# Patient Record
Sex: Male | Born: 1955 | Race: Black or African American | Hispanic: No | Marital: Married | Smoking: Never smoker
Health system: Southern US, Community
[De-identification: ages and names within clinical notes are randomized; demographics above are authoritative.]

---

## 2003-08-14 ENCOUNTER — Emergency Department (HOSPITAL_COMMUNITY): Admission: EM | Admit: 2003-08-14 | Discharge: 2003-08-14 | Payer: Self-pay | Admitting: Emergency Medicine

## 2008-04-07 ENCOUNTER — Ambulatory Visit (HOSPITAL_COMMUNITY): Admission: RE | Admit: 2008-04-07 | Discharge: 2008-04-07 | Payer: Self-pay | Admitting: Family Medicine

## 2010-07-10 NOTE — Procedures (Signed)
Charles Hurst, Charles Hurst NO.:  [id number]   MEDICAL RECORD NO.:  [id number]          PATIENT TYPE:  OUT   LOCATION:  [facility]                          FACILITY:  [facility]   PHYSICIAN:  Donna Bernard, M.D.DATE OF BIRTH:  01-Jul-1955   DATE OF PROCEDURE:  DATE OF DISCHARGE:                                  STRESS TEST   INDICATION:  This patient is a 55 year old African American male with a  history of atypical discomfort, which occurred while shoveling snow.  He  does have some risk factors, so we felt that stress testing was an  appropriate intervention.  The patient has had no chest discomfort since  the day of shoveling snow.   Stress test was performed with standard Bruce protocol.  His EKG  revealed normal sinus rhythm with no significant ST-T changes.  The  patient's initial blood pressure was 138/82.  The patient tolerated the  first two stages well, into the third stage, he started to have a bit of  shortness of breath as expected.  He also generated hypertensive  responses but expected with maximum systolic of 180, no time during the  test did the patient have any chest pain.  At 40 seconds into the fourth  stage, the test was stopped on the basis of having surpassed the  __________max predicted heart rate and reached a heart rate of 154.  At  this point, his ST segment at 0.08 seconds past the J-point showed a  sharply ascending slope with minimal ST depression in all leads.  The  patient during the rest phase returned to his normal heart rate quickly.  He did have continued ST segment changes, which lasted for couple of  minutes, but continued to maintain a sharp ascending slope at 0.08  seconds past the J-point.   IMPRESSION:  Negative adequate stress test.   PLAN:  The patient encouraged to get on with regular exercise program.  Rationale discussed.  Follow up in the office as scheduled.      Donna Bernard, M.D.  Electronically Signed     WSL/MEDQ  D:  04/07/2008  T:  04/07/2008  Job:  [id number]

## 2012-09-05 ENCOUNTER — Other Ambulatory Visit: Payer: Self-pay | Admitting: Family Medicine

## 2012-11-09 ENCOUNTER — Other Ambulatory Visit: Payer: Self-pay | Admitting: Family Medicine

## 2012-12-11 ENCOUNTER — Ambulatory Visit (INDEPENDENT_AMBULATORY_CARE_PROVIDER_SITE_OTHER): Payer: BC Managed Care – PPO | Admitting: Family Medicine

## 2012-12-11 ENCOUNTER — Encounter: Payer: Self-pay | Admitting: Family Medicine

## 2012-12-11 VITALS — BP 146/90 | Ht 70.0 in | Wt 164.2 lb

## 2012-12-11 DIAGNOSIS — E785 Hyperlipidemia, unspecified: Secondary | ICD-10-CM

## 2012-12-11 DIAGNOSIS — Z79899 Other long term (current) drug therapy: Secondary | ICD-10-CM

## 2012-12-11 DIAGNOSIS — Z0000 Encounter for general adult medical examination without abnormal findings: Secondary | ICD-10-CM

## 2012-12-11 DIAGNOSIS — R7301 Impaired fasting glucose: Secondary | ICD-10-CM

## 2012-12-11 DIAGNOSIS — I10 Essential (primary) hypertension: Secondary | ICD-10-CM

## 2012-12-11 MED ORDER — HYDROCHLOROTHIAZIDE 25 MG PO TABS: ORAL_TABLET | ORAL | Status: DC

## 2012-12-11 MED ORDER — ENALAPRIL MALEATE 20 MG PO TABS: ORAL_TABLET | ORAL | Status: DC

## 2012-12-11 MED ORDER — VERAPAMIL HCL ER 240 MG PO TBCR: EXTENDED_RELEASE_TABLET | ORAL | Status: DC

## 2012-12-11 NOTE — Progress Notes (Signed)
  Subjective:    Patient ID: Charles Hurst, male    DOB: [birth year], 57 y.o.   MRN: [id number]  HPI Patient is here today for a check up.cks bp elsewhere. Numbers at home 119 over 80  He needs a refill on his meds.exercising regularly with the dogs. Walking at work.   No concerns. No obvious side effects from his medication.  Patient has history of impaired fasting glucose. He has tried to cut down sugars in his diet. Exercising regularly.  Patient also has history of hyperlipidemia. He has tried to cut down his cholesterol intake. Cut down fried foods and greasy foods.  Review of Systems No chest pain no headache no back pain no abdominal pain no change in bowel habits ROS otherwise negative    Objective:   Physical Exam Alert HEENT normal. Lungs clear. Heart regular in rhythm. Abdomen soft. Ankles without edema. Blood pressure repeat 138/84       Assessment & Plan:  Impression 1 hypertension good control. #2 hyperlipidemia status uncertain. Discussed. #3 impaired fasting glucose status discussed uncertain. Plan diet exercise discussed. Maintain same meds. Appropriate blood work. Meds followup in 6 months. WSL

## 2012-12-13 DIAGNOSIS — E785 Hyperlipidemia, unspecified: Secondary | ICD-10-CM | POA: Insufficient documentation

## 2012-12-13 DIAGNOSIS — I10 Essential (primary) hypertension: Secondary | ICD-10-CM | POA: Insufficient documentation

## 2012-12-13 DIAGNOSIS — R7301 Impaired fasting glucose: Secondary | ICD-10-CM | POA: Insufficient documentation

## 2013-02-26 ENCOUNTER — Ambulatory Visit (INDEPENDENT_AMBULATORY_CARE_PROVIDER_SITE_OTHER): Payer: BC Managed Care – PPO | Admitting: Nurse Practitioner

## 2013-02-26 ENCOUNTER — Encounter: Payer: Self-pay | Admitting: Nurse Practitioner

## 2013-02-26 VITALS — BP 150/90 | Temp 98.3°F | Ht 70.0 in | Wt 166.1 lb

## 2013-02-26 DIAGNOSIS — K589 Irritable bowel syndrome without diarrhea: Secondary | ICD-10-CM

## 2013-02-26 MED ORDER — HYOSCYAMINE SULFATE 0.125 MG SL SUBL
0.1250 mg | SUBLINGUAL_TABLET | SUBLINGUAL | Status: DC | PRN
Start: 2013-02-26 — End: 2013-10-11

## 2013-03-01 ENCOUNTER — Encounter: Payer: Self-pay | Admitting: Nurse Practitioner

## 2013-03-01 DIAGNOSIS — K589 Irritable bowel syndrome without diarrhea: Secondary | ICD-10-CM | POA: Insufficient documentation

## 2013-03-01 NOTE — Progress Notes (Signed)
Subjective:  Charles Hurst complaints of pain in the left lower quadrant of the abdomen for the past 2 days. Has a history of duodenal ulcer and acid reflux both which are stable. No fever. No nausea vomiting. No constipation or diarrhea. Stools normal color. No obvious blood. Pain has improved. Describes as a discomfort with occasional cramping. No severe pain. Has a history of IBS. No urinary symptoms.  Objective:   BP 150/90  Temp(Src) 98.3 F (36.8 C) (Oral)  Ht 5\' 10"  (1.778 m)  Wt 166 lb 2 oz (75.354 kg)  BMI 23.84 kg/m2 NAD. Alert, oriented. Lungs clear. Heart regular rate rhythm. Abdomen soft nondistended with hyperactive bowel sounds x4. Minimal left lower quadrant tenderness on examination, exam otherwise benign. No rebound or guarding. No obvious masses.  Assessment:Irritable bowel syndrome  Plan: Meds ordered this encounter  Medications  . hyoscyamine (LEVSIN/SL) 0.125 MG SL tablet    Sig: Place 1 tablet (0.125 mg total) under the tongue every 4 (four) hours as needed.    Dispense:  30 tablet    Refill:  0    Order Specific Question:  Supervising Provider    Answer:  Merlyn AlbertLUKING, WILLIAM S [[id number]]   Reviewed warning signs including fever severe abdominal pain bloody stools. Patient to go to ED over the weekend if symptoms worsen. Recheck if persists. Also strongly recommend screening colonoscopy.

## 2013-03-01 NOTE — Assessment & Plan Note (Signed)
.   hyoscyamine (LEVSIN/SL) 0.125 MG SL tablet    Sig: Place 1 tablet (0.125 mg total) under the tongue every 4 (four) hours as needed.    Dispense:  30 tablet    Refill:  0    Order Specific Question:  Supervising Provider    Answer:  Merlyn AlbertLUKING, WILLIAM S [[id number]]   Reviewed warning signs including fever severe abdominal pain bloody stools. Patient to go to ED over the weekend if symptoms worsen. Recheck if persists. Also strongly recommend screening colonoscopy.

## 2013-03-06 LAB — HEPATIC FUNCTION PANEL
ALT: 23 U/L (ref 0–53)
AST: 31 U/L (ref 0–37)
Albumin: 4.4 g/dL (ref 3.5–5.2)
Alkaline Phosphatase: 115 U/L (ref 39–117)
Bilirubin, Direct: 0.1 mg/dL (ref 0.0–0.3)
Indirect Bilirubin: 0.4 mg/dL (ref 0.0–0.9)
Total Bilirubin: 0.5 mg/dL (ref 0.3–1.2)
Total Protein: 7 g/dL (ref 6.0–8.3)

## 2013-03-06 LAB — LIPID PANEL
Cholesterol: 229 mg/dL — ABNORMAL HIGH (ref 0–200)
HDL: 66 mg/dL (ref >39)
LDL Cholesterol: 153 mg/dL — ABNORMAL HIGH (ref 0–99)
Total CHOL/HDL Ratio: 3.5 Ratio
Triglycerides: 50 mg/dL (ref <150)
VLDL: 10 mg/dL (ref 0–40)

## 2013-03-06 LAB — GLUCOSE, RANDOM: Glucose, Bld: 113 mg/dL — ABNORMAL HIGH (ref 70–99)

## 2013-03-24 ENCOUNTER — Encounter: Payer: Self-pay | Admitting: Family Medicine

## 2013-06-21 ENCOUNTER — Other Ambulatory Visit: Payer: Self-pay | Admitting: Family Medicine

## 2013-07-14 ENCOUNTER — Encounter: Payer: Self-pay | Admitting: Family Medicine

## 2013-07-14 ENCOUNTER — Ambulatory Visit (INDEPENDENT_AMBULATORY_CARE_PROVIDER_SITE_OTHER): Payer: BC Managed Care – PPO | Admitting: Family Medicine

## 2013-07-14 VITALS — BP 146/94 | Ht 70.0 in | Wt 163.0 lb

## 2013-07-14 DIAGNOSIS — I10 Essential (primary) hypertension: Secondary | ICD-10-CM

## 2013-07-14 DIAGNOSIS — E785 Hyperlipidemia, unspecified: Secondary | ICD-10-CM

## 2013-07-14 DIAGNOSIS — J309 Allergic rhinitis, unspecified: Secondary | ICD-10-CM

## 2013-07-14 DIAGNOSIS — K589 Irritable bowel syndrome without diarrhea: Secondary | ICD-10-CM

## 2013-07-14 DIAGNOSIS — R7301 Impaired fasting glucose: Secondary | ICD-10-CM

## 2013-07-14 MED ORDER — HYDROCHLOROTHIAZIDE 25 MG PO TABS: 25.0000 mg | ORAL_TABLET | Freq: Every day | ORAL | Status: DC

## 2013-07-14 MED ORDER — VERAPAMIL HCL ER 240 MG PO TBCR: 240.0000 mg | EXTENDED_RELEASE_TABLET | Freq: Every day | ORAL | Status: DC

## 2013-07-14 MED ORDER — ENALAPRIL MALEATE 20 MG PO TABS: 20.0000 mg | ORAL_TABLET | Freq: Every day | ORAL | Status: DC

## 2013-07-14 NOTE — Progress Notes (Signed)
   Subjective:    Patient ID: Jacquiline DoeGerald Q Messer, male    DOB: 05/02/1955, 58 y.o.   MRN: [id number]  HPIHypertension check up. Checks blood pressure at home. States numbers are good. Exercise. Walks about every other day. Follows a healthy diet.   Allergies. Having a cough at night. Taking mucinex. Allergies bothering some, mucinex has calmed it down  Uses claritin orin past  Walking every other day  Mostly eating the right stuff, eting right  occas soda mostly good p o  Compliant with blood pressure medications. Review of Systems No headache no chest pain or back pain no abdominal pain no change in bowel habits no blood in stool ROS otherwise negative    Objective:   Physical Exam Alert no apparent distress. HEENT normal. Lungs clear. Heart regular in rhythm. Ankles without edema.       Assessment & Plan:  Pressure 1 hypertension good control. #2 allergic rhinitis discussed. #3 glucose intolerance discuss. #4 hyperlipidemia discussed. Plan diet exercise discussed. Maintain same medications. Symptomatic care discussed. Keep exercising. Check every 6 months. Of note had a recent prostate exam. Promises he will do a colonoscopy this fall. WSL

## 2013-07-22 ENCOUNTER — Other Ambulatory Visit: Payer: Self-pay | Admitting: Family Medicine

## 2013-09-09 ENCOUNTER — Telehealth: Payer: Self-pay | Admitting: Family Medicine

## 2013-09-09 DIAGNOSIS — Z79899 Other long term (current) drug therapy: Secondary | ICD-10-CM

## 2013-09-09 DIAGNOSIS — R7301 Impaired fasting glucose: Secondary | ICD-10-CM

## 2013-09-09 DIAGNOSIS — E785 Hyperlipidemia, unspecified: Secondary | ICD-10-CM

## 2013-09-09 DIAGNOSIS — Z125 Encounter for screening for malignant neoplasm of prostate: Secondary | ICD-10-CM

## 2013-09-09 NOTE — Telephone Encounter (Signed)
In addition to the PSA add lipid, metabolic 7, hemoglobin A1c.

## 2013-09-09 NOTE — Telephone Encounter (Signed)
bloodwork orders ready. Pt notified.  

## 2013-09-09 NOTE — Telephone Encounter (Signed)
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 He wants to know the urgency of this and when he should follow up?

## 2013-09-09 NOTE — Telephone Encounter (Signed)
I would recommend that the patient do a PSA. I also recommend that the patient followup within the next 30 days for office visit to be prepared to give a urine specimen when he comes in addition to this we will be checking his prostate. Although benign conditions can cause blood to show up in the urine so can cancer. This is something that he should follow through with. It is not wise to ignore this. Although it is not an emergent condition he should handle this within the next 30 days.(DOT does a dipstick test which can detect blood that may not be seen on a visual exam so therefore he would need to come to do the above things in order to be 100% safe to make sure there is not additional issues causing his problem)

## 2013-09-09 NOTE — Telephone Encounter (Signed)
Not having any urinary symptoms. No fever, no abd pain, no back pain, no frequent urination, has not seen any blood in his urine.

## 2013-09-09 NOTE — Telephone Encounter (Signed)
Pt would like to know if he needs any additional bloodwork besides psa. Last bw 03/06/13 lipid, liver, glucose. He has hyperlipidema, impaired fasting glucose and hypertension.  He made a follow up appt with Dr. Brett CanalesSteve.

## 2013-09-11 LAB — HEMOGLOBIN A1C
HEMOGLOBIN A1C: 5.6 % (ref <5.7)
Mean Plasma Glucose: 114 mg/dL (ref <117)

## 2013-09-11 LAB — LIPID PANEL
CHOL/HDL RATIO: 3.1 ratio
Cholesterol: 235 mg/dL — ABNORMAL HIGH (ref 0–200)
HDL: 76 mg/dL (ref >39)
LDL Cholesterol: 149 mg/dL — ABNORMAL HIGH (ref 0–99)
Triglycerides: 49 mg/dL (ref <150)
VLDL: 10 mg/dL (ref 0–40)

## 2013-09-11 LAB — BASIC METABOLIC PANEL
BUN: 17 mg/dL (ref 6–23)
CALCIUM: 9.8 mg/dL (ref 8.4–10.5)
CO2: 29 meq/L (ref 19–32)
Chloride: 102 mEq/L (ref 96–112)
Creat: 1.48 mg/dL — ABNORMAL HIGH (ref 0.50–1.35)
GLUCOSE: 107 mg/dL — AB (ref 70–99)
Potassium: 4.4 mEq/L (ref 3.5–5.3)
Sodium: 141 mEq/L (ref 135–145)

## 2013-09-13 LAB — PSA: PSA: 0.81 ng/mL (ref <=4.00)

## 2013-10-11 ENCOUNTER — Ambulatory Visit (INDEPENDENT_AMBULATORY_CARE_PROVIDER_SITE_OTHER): Payer: BC Managed Care – PPO | Admitting: Family Medicine

## 2013-10-11 ENCOUNTER — Encounter: Payer: Self-pay | Admitting: Family Medicine

## 2013-10-11 VITALS — BP 134/90 | Temp 98.7°F | Ht 70.0 in | Wt 164.5 lb

## 2013-10-11 DIAGNOSIS — R319 Hematuria, unspecified: Secondary | ICD-10-CM

## 2013-10-11 LAB — POCT URINALYSIS DIPSTICK: pH, UA: 7

## 2013-10-11 MED ORDER — HYDROCHLOROTHIAZIDE 25 MG PO TABS: 25.0000 mg | ORAL_TABLET | Freq: Every day | ORAL | Status: DC

## 2013-10-11 MED ORDER — DICLOFENAC SODIUM 75 MG PO TBEC: 75.0000 mg | DELAYED_RELEASE_TABLET | Freq: Two times a day (BID) | ORAL | Status: DC

## 2013-10-11 MED ORDER — ENALAPRIL MALEATE 20 MG PO TABS: ORAL_TABLET | ORAL | Status: DC

## 2013-10-11 MED ORDER — VERAPAMIL HCL ER 240 MG PO TBCR: 240.0000 mg | EXTENDED_RELEASE_TABLET | Freq: Every day | ORAL | Status: DC

## 2013-10-11 NOTE — Progress Notes (Signed)
   Subjective:    Patient ID: Charles Hurst, male    DOB: [birth year], [age] y.o.   MRN: [id number]  HPI Patient is here today because he had a wellness visit with his employer and they found a trace of blood in his urine. This was done in July 2015. Patient is here today to follow up on this issue.    Patient has some right arm pain that has been present off and on for about 1 month now. Lat elbow tenderness for the past month. No sig injury.  compliant with bp meds  occas advil, helped a bit, generally does not take med.  Results for orders placed in visit on 10/11/13  POCT URINALYSIS DIPSTICK      Result Value Ref Range   Color, UA       Clarity, UA       Glucose, UA       Bilirubin, UA       Ketones, UA       Spec Grav, UA <=1.005     Blood, UA       pH, UA 7.0     Protein, UA       Urobilinogen, UA       Nitrite, UA       Leukocytes, UA       patient claims compliance with his blood pressure medicine. Exercises a fair amount. Watch his salt intake. No obvious side effects from the medicine.  Review of Systems No headache no chest pain and back pain no abdominal pain no change in bowel habits no blood in stool or urine.    Objective:   Physical Exam Alert no apparent distress. HEENT normal. Blood pressure good on repeat. Lungs clear. Heart regular in rhythm. Ankles within normal limits. Prostate nontender no nodules. Right lateral epicondyle tenderness palpation Ears no red blood cells on microscopic       Assessment & Plan:  Impression 1 hematuria on employee physical none evident at this time. PSA excellent. #2 hypertension good control. #3 lateral epicondylitis discussed plan diet exercise discussed. No further workup of urine at this time. Form strap for epicondylitis. Anti-inflammatory medicine recommended. Maintain same chronic meds. Check every 6 months. WSL

## 2014-01-10 ENCOUNTER — Ambulatory Visit: Payer: BC Managed Care – PPO | Admitting: Family Medicine

## 2014-02-04 ENCOUNTER — Other Ambulatory Visit: Payer: Self-pay | Admitting: Family Medicine

## 2014-03-08 ENCOUNTER — Other Ambulatory Visit: Payer: Self-pay | Admitting: Family Medicine

## 2014-04-05 ENCOUNTER — Encounter: Payer: Self-pay | Admitting: Family Medicine

## 2014-04-05 ENCOUNTER — Other Ambulatory Visit: Payer: Self-pay

## 2014-04-05 ENCOUNTER — Ambulatory Visit (INDEPENDENT_AMBULATORY_CARE_PROVIDER_SITE_OTHER): Payer: Managed Care, Other (non HMO) | Admitting: Family Medicine

## 2014-04-05 VITALS — BP 142/80 | Ht 70.0 in | Wt 173.0 lb

## 2014-04-05 DIAGNOSIS — I10 Essential (primary) hypertension: Secondary | ICD-10-CM

## 2014-04-05 MED ORDER — ENALAPRIL MALEATE 20 MG PO TABS: 20.0000 mg | ORAL_TABLET | Freq: Two times a day (BID) | ORAL | Status: DC

## 2014-04-05 MED ORDER — VERAPAMIL HCL ER 240 MG PO TBCR: 240.0000 mg | EXTENDED_RELEASE_TABLET | Freq: Every day | ORAL | Status: DC

## 2014-04-05 MED ORDER — HYDROCHLOROTHIAZIDE 25 MG PO TABS: 25.0000 mg | ORAL_TABLET | Freq: Every day | ORAL | Status: DC

## 2014-04-05 NOTE — Progress Notes (Signed)
   Subjective:    Patient ID: Charles DoeGerald Q Currin, male    DOB: [birth year], 59 y.o.   MRN: [id number]  Hypertension This is a chronic problem. The current episode started more than 1 year ago. The problem has been gradually improving since onset. The problem is controlled. There are no associated agents to hypertension. There are no known risk factors for coronary artery disease. Treatments tried: enalapril. The current treatment provides significant improvement. There are no compliance problems.    Patient states that he has no concerns at this time.   Overall doing well,  Life's goood, bp excellent  Overall doing well  Good, eating well, grilled and not greasy  Exercises about 75 % of the time,   No headache no chest pain    Review of Systems    no abdominal pain no change in bowel habits no blood in stool Objective:   Physical Exam Alert blood pressure 140/78 on repeat. Lungs clear heart regular in rhythm H&T normal       Assessment & Plan:  Impression hypertension good control plan diet discussed. Exercise discussed. Medications refilled. Wellness exam plus visit in 6 months

## 2014-10-04 ENCOUNTER — Ambulatory Visit: Payer: Managed Care, Other (non HMO) | Admitting: Family Medicine

## 2014-10-18 ENCOUNTER — Other Ambulatory Visit: Payer: Self-pay | Admitting: Family Medicine

## 2014-10-27 ENCOUNTER — Encounter: Payer: Self-pay | Admitting: Family Medicine

## 2014-10-27 ENCOUNTER — Ambulatory Visit (INDEPENDENT_AMBULATORY_CARE_PROVIDER_SITE_OTHER): Payer: Managed Care, Other (non HMO) | Admitting: Family Medicine

## 2014-10-27 VITALS — BP 138/86 | Ht 70.0 in | Wt 165.0 lb

## 2014-10-27 DIAGNOSIS — Z79899 Other long term (current) drug therapy: Secondary | ICD-10-CM

## 2014-10-27 DIAGNOSIS — E785 Hyperlipidemia, unspecified: Secondary | ICD-10-CM

## 2014-10-27 DIAGNOSIS — R7302 Impaired glucose tolerance (oral): Secondary | ICD-10-CM

## 2014-10-27 DIAGNOSIS — I10 Essential (primary) hypertension: Secondary | ICD-10-CM | POA: Diagnosis not present

## 2014-10-27 DIAGNOSIS — Z125 Encounter for screening for malignant neoplasm of prostate: Secondary | ICD-10-CM

## 2014-10-27 NOTE — Progress Notes (Signed)
   Subjective:    Patient ID: Charles Hurst, male    DOB: 1955/07/28, 59 y.o.   MRN: [id number]  Hypertension This is a chronic problem. The current episode started more than 1 year ago.    Walking a lot, sig 3 exercise, thirty to fotrty min walk  walks 30 mins to one hour a day. Eats healthy. No concerns or problems.   Watching diet closely  Doing the  Results for orders placed or performed in visit on 10/11/13  POCT urinalysis dipstick  Result Value Ref Range   Color, UA     Clarity, UA     Glucose, UA     Bilirubin, UA     Ketones, UA     Spec Grav, UA <=1.005    Blood, UA     pH, UA 7.0    Protein, UA     Urobilinogen, UA     Nitrite, UA     Leukocytes, UA      Also has history of hyperlipidemia along with elevated glucose. Working hard on diet. Has cut sugars down. Also has cut down fats in the diet   Review of Systems    no headache no chest pain no back pain Objective:   Physical Exam   Alert vital stable HET normal lungs clear. Heart regular in rhythm. Blood pressure good repeat 130/86     Assessment & Plan:  Impression 1 hypertension good control #2 impaired fasting glucose and hyperlipidemia status uncertain plan medications refilled. Blood work prescribed diet exercise discussed follow-up in 6 months

## 2014-11-18 ENCOUNTER — Other Ambulatory Visit: Payer: Self-pay | Admitting: Family Medicine

## 2015-02-10 ENCOUNTER — Other Ambulatory Visit: Payer: Self-pay | Admitting: Family Medicine

## 2015-02-15 ENCOUNTER — Ambulatory Visit (INDEPENDENT_AMBULATORY_CARE_PROVIDER_SITE_OTHER): Payer: Managed Care, Other (non HMO) | Admitting: Nurse Practitioner

## 2015-02-15 ENCOUNTER — Encounter: Payer: Self-pay | Admitting: Nurse Practitioner

## 2015-02-15 VITALS — BP 122/80 | Temp 98.1°F | Ht 70.0 in | Wt 168.1 lb

## 2015-02-15 DIAGNOSIS — R05 Cough: Secondary | ICD-10-CM | POA: Diagnosis not present

## 2015-02-15 DIAGNOSIS — J310 Chronic rhinitis: Secondary | ICD-10-CM

## 2015-02-15 DIAGNOSIS — R059 Cough, unspecified: Secondary | ICD-10-CM

## 2015-02-15 MED ORDER — AZITHROMYCIN 250 MG PO TABS: ORAL_TABLET | ORAL | Status: DC

## 2015-02-15 MED ORDER — HYDROCODONE-HOMATROPINE 5-1.5 MG/5ML PO SYRP: 5.0000 mL | ORAL_SOLUTION | ORAL | Status: DC | PRN

## 2015-02-18 ENCOUNTER — Encounter: Payer: Self-pay | Admitting: Nurse Practitioner

## 2015-02-18 NOTE — Progress Notes (Signed)
Subjective:  Presents for c/o cough that began on 12/16. Non productive, worse at night. Began after blowing leaves. No fever, sore throat, headache, ear pain or wheezing. No reflux or abd pain.   Objective:   BP 122/80 mmHg  Temp(Src) 98.1 F (36.7 C) (Oral)  Ht 5\' 10"  (1.778 m)  Wt 168 lb 2 oz (76.261 kg)  BMI 24.12 kg/m2 NAD. Alert, oriented. TMs clear effusion. Pharynx injected with cloudy PND noted. Neck supple with mild anterior adenopathy. Lungs clear. Heart RRR.   Assessment: Mixed rhinitis  Cough  Plan:  Meds ordered this encounter  Medications  . azithromycin (ZITHROMAX Z-PAK) 250 MG tablet    Sig: Take 2 tablets (500 mg) on  Day 1,  followed by 1 tablet (250 mg) once daily on Days 2 through 5.    Dispense:  6 each    Refill:  0    Order Specific Question:  Supervising Provider    Answer:  Merlyn AlbertLUKING, WILLIAM S [[id number]]  . HYDROcodone-homatropine (HYCODAN) 5-1.5 MG/5ML syrup    Sig: Take 5 mLs by mouth every 4 (four) hours as needed.    Dispense:  120 mL    Refill:  0    Order Specific Question:  Supervising Provider    Answer:  Merlyn AlbertLUKING, WILLIAM S [[id number]]   Patient is going out of town. Given Zpack in case symptoms worsen. Start Robitussin DM as directed. Call back if worsens or persists.

## 2015-03-03 ENCOUNTER — Encounter: Payer: Self-pay | Admitting: Family Medicine

## 2015-03-03 ENCOUNTER — Ambulatory Visit (INDEPENDENT_AMBULATORY_CARE_PROVIDER_SITE_OTHER): Payer: Managed Care, Other (non HMO) | Admitting: Family Medicine

## 2015-03-03 VITALS — BP 150/90 | Temp 98.2°F | Ht 70.0 in | Wt 168.0 lb

## 2015-03-03 DIAGNOSIS — K629 Disease of anus and rectum, unspecified: Secondary | ICD-10-CM | POA: Diagnosis not present

## 2015-03-03 DIAGNOSIS — K602 Anal fissure, unspecified: Secondary | ICD-10-CM

## 2015-03-03 MED ORDER — HYDROCORTISONE 2.5 % RE CREA: 1.0000 "application " | TOPICAL_CREAM | Freq: Two times a day (BID) | RECTAL | Status: DC

## 2015-03-03 NOTE — Progress Notes (Signed)
   Subjective:    Patient ID: Jacquiline DoeGerald Q Marland, male    DOB: 02-05-1956, 60 y.o.   MRN: [id number]  HPIpossible hemorroid. noticied some blood when he had a bm. Having some stinging pain.   Very painful tend er  Had a hard stool   Last colon scope   Some discomfort with b m yest, None syest  Review of Systems no chest pain no back pain no change in bowel habits no currently in    Objective:   Physical Exam Alert. No acute distress. Lungs clear. Heart regular in rhythm. Vitals stable. Perirectal exam reveals a distinct anal fissure no hemorrhoid. Rectal exam otherwise normal       Assessment & Plan:  Impression rectal fissure discussed at length plan symptom care discussed local measures discussed WSL

## 2015-03-03 NOTE — Patient Instructions (Signed)
Rectal fissure--get a scoop daily of miralax and use next couple weeks, then as needed

## 2015-03-05 ENCOUNTER — Encounter: Payer: Self-pay | Admitting: Family Medicine

## 2015-03-15 ENCOUNTER — Other Ambulatory Visit: Payer: Self-pay | Admitting: Family Medicine

## 2015-04-26 ENCOUNTER — Ambulatory Visit: Payer: Managed Care, Other (non HMO) | Admitting: Family Medicine

## 2015-05-22 ENCOUNTER — Other Ambulatory Visit: Payer: Self-pay | Admitting: Family Medicine

## 2015-06-23 ENCOUNTER — Other Ambulatory Visit: Payer: Self-pay | Admitting: Family Medicine

## 2015-06-27 ENCOUNTER — Other Ambulatory Visit: Payer: Self-pay | Admitting: Family Medicine

## 2015-06-28 LAB — HEPATIC FUNCTION PANEL
ALBUMIN: 4.8 g/dL (ref 3.6–4.8)
ALK PHOS: 135 IU/L — AB (ref 39–117)
ALT: 19 IU/L (ref 0–44)
AST: 22 IU/L (ref 0–40)
BILIRUBIN TOTAL: 0.4 mg/dL (ref 0.0–1.2)
Bilirubin, Direct: 0.13 mg/dL (ref 0.00–0.40)
TOTAL PROTEIN: 7.4 g/dL (ref 6.0–8.5)

## 2015-06-28 LAB — BASIC METABOLIC PANEL
BUN/Creatinine Ratio: 10 (ref 10–24)
BUN: 14 mg/dL (ref 8–27)
CHLORIDE: 100 mmol/L (ref 96–106)
CO2: 25 mmol/L (ref 18–29)
Calcium: 9.9 mg/dL (ref 8.6–10.2)
Creatinine, Ser: 1.43 mg/dL — ABNORMAL HIGH (ref 0.76–1.27)
GFR calc non Af Amer: 52 mL/min/{1.73_m2} — ABNORMAL LOW (ref >59)
GFR, EST AFRICAN AMERICAN: 61 mL/min/{1.73_m2} (ref >59)
GLUCOSE: 139 mg/dL — AB (ref 65–99)
Potassium: 3.7 mmol/L (ref 3.5–5.2)
SODIUM: 143 mmol/L (ref 134–144)

## 2015-06-28 LAB — HEMOGLOBIN A1C
ESTIMATED AVERAGE GLUCOSE: 111 mg/dL
Hgb A1c MFr Bld: 5.5 % (ref 4.8–5.6)

## 2015-06-28 LAB — LIPID PANEL
CHOLESTEROL TOTAL: 244 mg/dL — AB (ref 100–199)
Chol/HDL Ratio: 3.1 ratio units (ref 0.0–5.0)
HDL: 79 mg/dL (ref >39)
LDL CALC: 151 mg/dL — AB (ref 0–99)
Triglycerides: 72 mg/dL (ref 0–149)
VLDL CHOLESTEROL CAL: 14 mg/dL (ref 5–40)

## 2015-06-28 LAB — PSA: PROSTATE SPECIFIC AG, SERUM: 0.9 ng/mL (ref 0.0–4.0)

## 2015-06-30 ENCOUNTER — Ambulatory Visit (INDEPENDENT_AMBULATORY_CARE_PROVIDER_SITE_OTHER): Payer: Managed Care, Other (non HMO) | Admitting: Family Medicine

## 2015-06-30 ENCOUNTER — Encounter: Payer: Self-pay | Admitting: Family Medicine

## 2015-06-30 VITALS — BP 120/76 | Ht 70.0 in | Wt 162.0 lb

## 2015-06-30 DIAGNOSIS — I10 Essential (primary) hypertension: Secondary | ICD-10-CM

## 2015-06-30 DIAGNOSIS — R7302 Impaired glucose tolerance (oral): Secondary | ICD-10-CM

## 2015-06-30 DIAGNOSIS — E785 Hyperlipidemia, unspecified: Secondary | ICD-10-CM

## 2015-06-30 MED ORDER — HYDROCHLOROTHIAZIDE 25 MG PO TABS: ORAL_TABLET | ORAL | Status: DC

## 2015-06-30 MED ORDER — ENALAPRIL MALEATE 20 MG PO TABS: 20.0000 mg | ORAL_TABLET | Freq: Two times a day (BID) | ORAL | Status: DC

## 2015-06-30 MED ORDER — VERAPAMIL HCL ER 240 MG PO TBCR: 240.0000 mg | EXTENDED_RELEASE_TABLET | Freq: Every day | ORAL | Status: DC

## 2015-06-30 NOTE — Patient Instructions (Signed)
Results for orders placed or performed in visit on 06/27/15  Basic metabolic panel  Result Value Ref Range   Glucose 139 (H) 65 - 99 mg/dL   BUN 14 8 - 27 mg/dL   Creatinine, Ser 0.101.43 (H) 0.76 - 1.27 mg/dL   GFR calc non Af Amer 52 (L) >59 mL/min/1.73   GFR calc Af Amer 61 >59 mL/min/1.73   BUN/Creatinine Ratio 10 10 - 24   Sodium 143 134 - 144 mmol/L   Potassium 3.7 3.5 - 5.2 mmol/L   Chloride 100 96 - 106 mmol/L   CO2 25 18 - 29 mmol/L   Calcium 9.9 8.6 - 10.2 mg/dL  Lipid panel  Result Value Ref Range   Cholesterol, Total 244 (H) 100 - 199 mg/dL   Triglycerides 72 0 - 149 mg/dL   HDL 79 >27>39 mg/dL   VLDL Cholesterol Cal 14 5 - 40 mg/dL   LDL Calculated 253151 (H) 0 - 99 mg/dL   Chol/HDL Ratio 3.1 0.0 - 5.0 ratio units  Hepatic function panel  Result Value Ref Range   Total Protein 7.4 6.0 - 8.5 g/dL   Albumin 4.8 3.6 - 4.8 g/dL   Bilirubin Total 0.4 0.0 - 1.2 mg/dL   Bilirubin, Direct 6.640.13 0.00 - 0.40 mg/dL   Alkaline Phosphatase 135 (H) 39 - 117 IU/L   AST 22 0 - 40 IU/L   ALT 19 0 - 44 IU/L  Hemoglobin A1c  Result Value Ref Range   Hgb A1c MFr Bld 5.5 4.8 - 5.6 %   Est. average glucose Bld gHb Est-mCnc 111 mg/dL  PSA  Result Value Ref Range   Prostate Specific Ag, Serum 0.9 0.0 - 4.0 ng/mL

## 2015-06-30 NOTE — Progress Notes (Signed)
Subjective:    Patient ID: Charles Hurst, male    DOB: 1955/11/05, 61 y.o.   MRN: [id number]  Hypertension This is a chronic problem. The current episode started more than 1 year ago. There are no compliance problems (eats healthy and exercises).    patient claims compliance with blood pressure medicine. Watching salt intake. Generally does not miss dose. Next  Patient where he has a challenge with lipids. Reports fair compliance with fats in his diet.  Patient reports having drank some sugary drinks the night before the blood work. Glucose was up to 139. However A1c is good at 5.5 intact improved   Pt states no problems or concerns. Needs refills on all meds.  Does not miss med  Needs ref on meds  Results for orders placed or performed in visit on 06/27/15  Basic metabolic panel  Result Value Ref Range   Glucose 139 (H) 65 - 99 mg/dL   BUN 14 8 - 27 mg/dL   Creatinine, Ser 2.13 (H) 0.76 - 1.27 mg/dL   GFR calc non Af Amer 52 (L) >59 mL/min/1.73   GFR calc Af Amer 61 >59 mL/min/1.73   BUN/Creatinine Ratio 10 10 - 24   Sodium 143 134 - 144 mmol/L   Potassium 3.7 3.5 - 5.2 mmol/L   Chloride 100 96 - 106 mmol/L   CO2 25 18 - 29 mmol/L   Calcium 9.9 8.6 - 10.2 mg/dL  Lipid panel  Result Value Ref Range   Cholesterol, Total 244 (H) 100 - 199 mg/dL   Triglycerides 72 0 - 149 mg/dL   HDL 79 >08 mg/dL   VLDL Cholesterol Cal 14 5 - 40 mg/dL   LDL Calculated 657 (H) 0 - 99 mg/dL   Chol/HDL Ratio 3.1 0.0 - 5.0 ratio units  Hepatic function panel  Result Value Ref Range   Total Protein 7.4 6.0 - 8.5 g/dL   Albumin 4.8 3.6 - 4.8 g/dL   Bilirubin Total 0.4 0.0 - 1.2 mg/dL   Bilirubin, Direct 8.46 0.00 - 0.40 mg/dL   Alkaline Phosphatase 135 (H) 39 - 117 IU/L   AST 22 0 - 40 IU/L   ALT 19 0 - 44 IU/L  Hemoglobin A1c  Result Value Ref Range   Hgb A1c MFr Bld 5.5 4.8 - 5.6 %   Est. average glucose Bld gHb Est-mCnc 111 mg/dL  PSA  Result Value Ref Range   Prostate Specific  Ag, Serum 0.9 0.0 - 4.0 ng/mL     Ex now much bettter, watchng exercise   Watching closely   cks now and then,   Review of Systems No headache, no major weight loss or weight gain, no chest pain no back pain abdominal pain no change in bowel habits complete ROS otherwise negative     Objective:   Physical Exam  Alert vitals stable blood pressure good on repeat. HEENT normal lungs clear heart rare rhythm      Assessment & Plan:  Impression 1 hypertension good control discussed maintain same medicine #2 hyperlipidemia suboptimal discussed yet decent due to very strong HDL #3 impaired fasting glucose ongoing concern but A1c excellent at this time number for preventive measures discussed including colonoscopy. Plan given colonoscopy sheet strongly encouraged to get on with that. Shingles vaccine discussed in encourage. Medications refilled diet exercise discussed recheck in 6 months. Patient states will definitely press on get colonoscopy done. Of note prostate within normal limits when checked earlier this year for rectal fissure  evaluation WSL

## 2015-10-27 LAB — HM COLONOSCOPY

## 2016-01-01 ENCOUNTER — Encounter: Payer: Self-pay | Admitting: Family Medicine

## 2016-01-01 ENCOUNTER — Ambulatory Visit (INDEPENDENT_AMBULATORY_CARE_PROVIDER_SITE_OTHER): Payer: Managed Care, Other (non HMO) | Admitting: Family Medicine

## 2016-01-01 VITALS — BP 118/78 | Ht 70.0 in | Wt 152.0 lb

## 2016-01-01 DIAGNOSIS — I10 Essential (primary) hypertension: Secondary | ICD-10-CM

## 2016-01-01 DIAGNOSIS — Z0000 Encounter for general adult medical examination without abnormal findings: Secondary | ICD-10-CM

## 2016-01-01 MED ORDER — VERAPAMIL HCL ER 240 MG PO TBCR: 240.0000 mg | EXTENDED_RELEASE_TABLET | Freq: Every day | ORAL | 5 refills | Status: DC

## 2016-01-01 MED ORDER — HYDROCHLOROTHIAZIDE 25 MG PO TABS: ORAL_TABLET | ORAL | 5 refills | Status: DC

## 2016-01-01 MED ORDER — ENALAPRIL MALEATE 20 MG PO TABS: 20.0000 mg | ORAL_TABLET | Freq: Two times a day (BID) | ORAL | 5 refills | Status: DC

## 2016-01-01 NOTE — Progress Notes (Signed)
   Subjective:    Patient ID: Charles Hurst, male    DOB: Jul 13, 1955, 60 y.o.   MRN: [id number]  Hypertension  This is a chronic problem. The current episode started more than 1 year ago. Pertinent negatives include no chest pain, headaches or neck pain. There are no compliance problems.    Patient states no other concern this visit.   Pt does not want to do co,onoscopy, but virtual colonoscopy agreeable to.  BMs regular, no blood in stool , reg soft st   Blood pressure medicine and blood pressure levels reviewed today with patient. Compliant with blood pressure medicine. States does not miss a dose. No obvious side effects. Blood pressure generally good when checked elsewhere. Watching salt intake.    Review of Systems  Constitutional: Negative for activity change, appetite change and fever.  HENT: Negative for congestion and rhinorrhea.   Eyes: Negative for discharge.  Respiratory: Negative for cough and wheezing.   Cardiovascular: Negative for chest pain.  Gastrointestinal: Negative for abdominal pain, blood in stool and vomiting.  Genitourinary: Negative for difficulty urinating and frequency.  Musculoskeletal: Negative for neck pain.  Skin: Negative for rash.  Allergic/Immunologic: Negative for environmental allergies and food allergies.  Neurological: Negative for weakness and headaches.  Psychiatric/Behavioral: Negative for agitation.  All other systems reviewed and are negative.      Objective:   Physical Exam  Constitutional: He appears well-developed and well-nourished.  HENT:  Head: Normocephalic and atraumatic.  Right Ear: External ear normal.  Left Ear: External ear normal.  Nose: Nose normal.  Mouth/Throat: Oropharynx is clear and moist.  Eyes: EOM are normal. Pupils are equal, round, and reactive to light.  Neck: Normal range of motion. Neck supple. No thyromegaly present.  Cardiovascular: Normal rate, regular rhythm and normal heart sounds.   No murmur  heard. Pulmonary/Chest: Effort normal and breath sounds normal. No respiratory distress. He has no wheezes.  Abdominal: Soft. Bowel sounds are normal. He exhibits no distension and no mass. There is no tenderness.  Genitourinary: Prostate normal and penis normal.  Genitourinary Comments: Prostate gland mild diffuse enlargement no nodules or abnormalities  Musculoskeletal: Normal range of motion. He exhibits no edema.  Lymphadenopathy:    He has no cervical adenopathy.  Neurological: He is alert. He exhibits normal muscle tone.  Skin: Skin is warm and dry. No erythema.  Psychiatric: He has a normal mood and affect. His behavior is normal. Judgment normal.  Vitals reviewed.         Assessment & Plan:  Impression well adult exam #2 hypertension good control blood pressure excellent on repeat compliant with medications. Meds reviewed patient to maintain #3 hyperlipidemia evident on the [place] blood work discussed #4 colonoscopy fears patient is adamantly against doing a regular colonoscopy. He wants to do a virtual colonoscopy, despite the literature stating this is not quite as good as a regular colonoscopy for screening for small lesions. Patient declines flu shot. Blood pressure medication refill. Diet exercise discussed check in 6 months

## 2016-01-08 ENCOUNTER — Telehealth: Payer: Self-pay | Admitting: Family Medicine

## 2016-01-08 NOTE — Telephone Encounter (Signed)
Prior Authorization for CT Colonography is pending further medical review. Case # [id number]. [facility]

## 2016-01-10 ENCOUNTER — Other Ambulatory Visit: Payer: Self-pay

## 2016-01-11 NOTE — Telephone Encounter (Signed)
[employer] authorization obtained and CT virtual colonoscopy rescheduled for 01/22/16-Patient is aware.

## 2016-01-17 ENCOUNTER — Other Ambulatory Visit: Payer: Self-pay

## 2016-01-22 ENCOUNTER — Ambulatory Visit
Admission: RE | Admit: 2016-01-22 | Discharge: 2016-01-22 | Disposition: A | Discharge status: Home / Self Care | Payer: Managed Care, Other (non HMO) | Source: Ambulatory Visit | Attending: Family Medicine | Admitting: Family Medicine

## 2016-01-22 DIAGNOSIS — Z0000 Encounter for general adult medical examination without abnormal findings: Secondary | ICD-10-CM

## 2016-06-28 ENCOUNTER — Ambulatory Visit (INDEPENDENT_AMBULATORY_CARE_PROVIDER_SITE_OTHER): Payer: Managed Care, Other (non HMO) | Admitting: Family Medicine

## 2016-06-28 ENCOUNTER — Encounter: Payer: Self-pay | Admitting: Family Medicine

## 2016-06-28 VITALS — BP 134/86 | Ht 70.0 in | Wt 159.4 lb

## 2016-06-28 DIAGNOSIS — I10 Essential (primary) hypertension: Secondary | ICD-10-CM | POA: Diagnosis not present

## 2016-06-28 MED ORDER — VERAPAMIL HCL ER 240 MG PO TBCR: 240.0000 mg | EXTENDED_RELEASE_TABLET | Freq: Every day | ORAL | 5 refills | Status: DC

## 2016-06-28 MED ORDER — ENALAPRIL MALEATE 20 MG PO TABS: 20.0000 mg | ORAL_TABLET | Freq: Two times a day (BID) | ORAL | 5 refills | Status: DC

## 2016-06-28 MED ORDER — HYDROCHLOROTHIAZIDE 25 MG PO TABS: ORAL_TABLET | ORAL | 5 refills | Status: DC

## 2016-06-28 NOTE — Progress Notes (Signed)
   Subjective:    Patient ID: Charles DoeGerald Q Salvino, male    DOB: [birth year], 61 y.o.   MRN: [id number]  Hypertension  This is a chronic problem. The current episode started more than 1 year ago. Risk factors for coronary artery disease include male gender. Treatments tried: verapamil and vasotec.   Results for orders placed or performed in visit on 06/27/15  Basic metabolic panel  Result Value Ref Range   Glucose 139 (H) 65 - 99 mg/dL   BUN 14 8 - 27 mg/dL   Creatinine, Ser 4.131.43 (H) 0.76 - 1.27 mg/dL   GFR calc non Af Amer 52 (L) >59 mL/min/1.73   GFR calc Af Amer 61 >59 mL/min/1.73   BUN/Creatinine Ratio 10 10 - 24   Sodium 143 134 - 144 mmol/L   Potassium 3.7 3.5 - 5.2 mmol/L   Chloride 100 96 - 106 mmol/L   CO2 25 18 - 29 mmol/L   Calcium 9.9 8.6 - 10.2 mg/dL  Lipid panel  Result Value Ref Range   Cholesterol, Total 244 (H) 100 - 199 mg/dL   Triglycerides 72 0 - 149 mg/dL   HDL 79 >24>39 mg/dL   VLDL Cholesterol Cal 14 5 - 40 mg/dL   LDL Calculated 401151 (H) 0 - 99 mg/dL   Chol/HDL Ratio 3.1 0.0 - 5.0 ratio units  Hepatic function panel  Result Value Ref Range   Total Protein 7.4 6.0 - 8.5 g/dL   Albumin 4.8 3.6 - 4.8 g/dL   Bilirubin Total 0.4 0.0 - 1.2 mg/dL   Bilirubin, Direct 0.270.13 0.00 - 0.40 mg/dL   Alkaline Phosphatase 135 (H) 39 - 117 IU/L   AST 22 0 - 40 IU/L   ALT 19 0 - 44 IU/L  Hemoglobin A1c  Result Value Ref Range   Hgb A1c MFr Bld 5.5 4.8 - 5.6 %   Est. average glucose Bld gHb Est-mCnc 111 mg/dL  PSA  Result Value Ref Range   Prostate Specific Ag, Serum 0.9 0.0 - 4.0 ng/mL    Staying busy and active  Top num 13o ish  To 80 ish   Blood pressure medicine and blood pressure levels reviewed today with patient. Compliant with blood pressure medicine. States does not miss a dose. No obvious side effects. Blood pressure generally good when checked elsewhere. Watching salt intake.    Review of Systems No headache, no major weight loss or weight gain, no chest  pain no back pain abdominal pain no change in bowel habits complete ROS otherwise negative     Objective:   Physical Exam   Alert vitals stable, NAD. Blood pressure good on repeat. HEENT normal. Lungs clear. Heart regular rate and rhythm.      Assessment & Plan:  Impression 1 hypertension good control discussed maintain same meds diet exercise discussed compliance discussed, wellness and chronic visit in 6 months

## 2016-11-27 ENCOUNTER — Encounter: Payer: Self-pay | Admitting: Family Medicine

## 2016-11-27 ENCOUNTER — Ambulatory Visit (INDEPENDENT_AMBULATORY_CARE_PROVIDER_SITE_OTHER): Payer: Managed Care, Other (non HMO) | Admitting: Family Medicine

## 2016-11-27 VITALS — BP 152/82 | Ht 70.0 in | Wt 156.0 lb

## 2016-11-27 DIAGNOSIS — M545 Low back pain, unspecified: Secondary | ICD-10-CM

## 2016-11-27 MED ORDER — CHLORZOXAZONE 500 MG PO TABS
500.0000 mg | ORAL_TABLET | Freq: Three times a day (TID) | ORAL | 0 refills | Status: DC | PRN
Start: 2016-11-27 — End: 2018-10-27

## 2016-11-27 MED ORDER — DICLOFENAC SODIUM 75 MG PO TBEC: 75.0000 mg | DELAYED_RELEASE_TABLET | Freq: Two times a day (BID) | ORAL | 0 refills | Status: DC

## 2016-11-27 NOTE — Progress Notes (Signed)
   Subjective:    Patient ID: Charles Hurst, male    DOB: [birth year], 61 y.o.   MRN: [id number]  Back Pain  This is a recurrent problem.   Patient has a history of back injury when employed by [employer] years ago. 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 Has taken tylenol and used hot and cold packs, which have helped some.    Hx of back problem and strain and injury off and on since then   Tris to use right lifting techniques  Felt ctch  rodw to g boro then developed suddend tenderness and pain   Left lower Lumbar region. A;t heat and cold and tylenol  Review of Systems  Musculoskeletal: Positive for back pain.       Objective:   Physical Exam Alert vitals stable, NAD. Blood pressure good on repeat. HEENT normal. Lungs clear. Heart regular rate and rhythm. Discrete left lower lumbar tenderness to deep palpation negative straight leg raise       Assessment & Plan:  Impression lumbar strain with element of spasm plan anti-inflammatory medicine prescribed. Muscle spasm as prescribed. Local measures discussed expect gradual resolution

## 2016-12-23 ENCOUNTER — Encounter: Payer: Self-pay | Admitting: Family Medicine

## 2016-12-23 ENCOUNTER — Ambulatory Visit (INDEPENDENT_AMBULATORY_CARE_PROVIDER_SITE_OTHER): Payer: Managed Care, Other (non HMO) | Admitting: Family Medicine

## 2016-12-23 VITALS — BP 138/76 | Ht 70.0 in | Wt 155.1 lb

## 2016-12-23 DIAGNOSIS — E785 Hyperlipidemia, unspecified: Secondary | ICD-10-CM

## 2016-12-23 DIAGNOSIS — Z79899 Other long term (current) drug therapy: Secondary | ICD-10-CM

## 2016-12-23 DIAGNOSIS — Z125 Encounter for screening for malignant neoplasm of prostate: Secondary | ICD-10-CM

## 2016-12-23 DIAGNOSIS — I10 Essential (primary) hypertension: Secondary | ICD-10-CM | POA: Diagnosis not present

## 2016-12-23 MED ORDER — VERAPAMIL HCL ER 240 MG PO TBCR: 240.0000 mg | EXTENDED_RELEASE_TABLET | Freq: Every day | ORAL | 5 refills | Status: DC

## 2016-12-23 MED ORDER — HYDROCHLOROTHIAZIDE 25 MG PO TABS: ORAL_TABLET | ORAL | 5 refills | Status: DC

## 2016-12-23 MED ORDER — ENALAPRIL MALEATE 20 MG PO TABS: 20.0000 mg | ORAL_TABLET | Freq: Two times a day (BID) | ORAL | 5 refills | Status: DC

## 2016-12-23 NOTE — Progress Notes (Signed)
   Subjective:    Patient ID: Jacquiline DoeGerald Q Angert, male    DOB: [birth year], 61 y.o.   MRN: [id number]  Hypertension  This is a recurrent problem. The current episode started more than 1 year ago.   States Bp's are much better. He is on Enalaipril 20 mg one BID, and Hydrochlorothiazide 25 mg one daily. Eats healthy and does exercise by walking. No concerns today.  Blood pressure medicine and blood pressure levels reviewed today with patient. Compliant with blood pressure medicine. States does not miss a dose. No obvious side effects. Blood pressure generally good when checked elsewhere. Watching salt intake.    Review of Systems No headache, no major weight loss or weight gain, no chest pain no back pain abdominal pain no change in bowel habits complete ROS otherwise negative     Objective:   Physical Exam   Alert vitals stable, NAD. Blood pressure good on repeat. HEENT normal. Lungs clear. Heart regular rate and rhythm.      Assessment & Plan:  Impression hypertension good control discussed to maintain same meds #2 impaired fasting glucose status uncertain plan medications refilled.  Appropriate blood work.  Diet exercise discussed/recommended wellness and chronic visit in 6 months

## 2017-01-13 ENCOUNTER — Encounter: Payer: Self-pay | Admitting: Family Medicine

## 2017-01-13 ENCOUNTER — Ambulatory Visit (INDEPENDENT_AMBULATORY_CARE_PROVIDER_SITE_OTHER): Payer: Managed Care, Other (non HMO) | Admitting: Family Medicine

## 2017-01-13 VITALS — BP 134/80 | Temp 98.9°F | Ht 70.0 in | Wt 158.0 lb

## 2017-01-13 DIAGNOSIS — J329 Chronic sinusitis, unspecified: Secondary | ICD-10-CM | POA: Diagnosis not present

## 2017-01-13 DIAGNOSIS — J310 Chronic rhinitis: Secondary | ICD-10-CM

## 2017-01-13 MED ORDER — CEFDINIR 300 MG PO CAPS: 300.0000 mg | ORAL_CAPSULE | Freq: Two times a day (BID) | ORAL | 0 refills | Status: DC

## 2017-01-13 NOTE — Progress Notes (Signed)
   Subjective:    Patient ID: Charles Hurst, male    DOB: [birth year], 61 y.o.   MRN: [id number]  Sinusitis  This is a new problem. Episode onset: 5 days. Associated symptoms include coughing. Treatments tried: hot steam bath.    Last week mid week  Started off with blowing leaves  Felt the "crap" in his sinuses  Then delveloped nostil and eye tenderness and soreness  Lots of dranage and then cong    Steam showers prn    Review of Systems  Respiratory: Positive for cough.        Objective:   Physical Exam Alert, mild malaise. Hydration good Vitals stable. frontal/ maxillary tenderness evident positive nasal congestion. pharynx normal neck supple  lungs clear/no crackles or wheezes. heart regular in rhythm        Assessment & Plan:  Impression rhinosinusitis likely post viral, discussed with patient. plan antibiotics prescribed. Questions answered. Symptomatic care discussed. warning signs discussed. WSL

## 2017-06-23 ENCOUNTER — Encounter: Payer: Managed Care, Other (non HMO) | Admitting: Family Medicine

## 2017-07-12 ENCOUNTER — Other Ambulatory Visit: Payer: Self-pay | Admitting: Family Medicine

## 2017-07-22 ENCOUNTER — Telehealth: Payer: Self-pay | Admitting: Family Medicine

## 2017-07-22 MED ORDER — HYDROCHLOROTHIAZIDE 25 MG PO TABS: ORAL_TABLET | ORAL | 0 refills | Status: DC

## 2017-07-22 MED ORDER — VERAPAMIL HCL ER 240 MG PO TBCR: 240.0000 mg | EXTENDED_RELEASE_TABLET | Freq: Every day | ORAL | 0 refills | Status: DC

## 2017-07-22 MED ORDER — ENALAPRIL MALEATE 20 MG PO TABS: 20.0000 mg | ORAL_TABLET | Freq: Two times a day (BID) | ORAL | 0 refills | Status: DC

## 2017-07-22 NOTE — Telephone Encounter (Signed)
Prescriptions sent electronically to pharmacy. Patient notified. °

## 2017-07-22 NOTE — Telephone Encounter (Signed)
Patient has an appointment on 07/31/17 with Dr. Brett Canales.  He is requesting a refill on his Verapamil, enalapril and hydrochlorothiazide.  He has medication for today, but will be out past tomorrow.  [facility]

## 2017-07-27 IMAGING — CT CT VIRTUAL COLONOSCOPY DIAGNOSTIC
3 of 7 series · 12 of 36 positions shown, 18 images · non-contrast
Comparison: None.

CLINICAL DATA: Screening.

EXAM:
CT VIRTUAL COLONOSCOPY DIAGNOSTIC
TECHNIQUE: The patient was given a standard bowel preparation with Gastrografin
and barium for fluid and stool tagging respectively. The quality of
the bowel preparation is excellent. Automated CO2 insufflation of
the colon was performed prior to image acquisition and colonic
distention is excellent. Image post processing was used to generate
a 3D endoluminal fly-through projection of the colon and to
electronically subtract stool/fluid as appropriate.

[Series 2: supine (id) · axial · 0.70mm/px · z∈[-444,-56]mm · 7 of 414 slices shown, 12 images]
[im 52/414  soft-tissue]
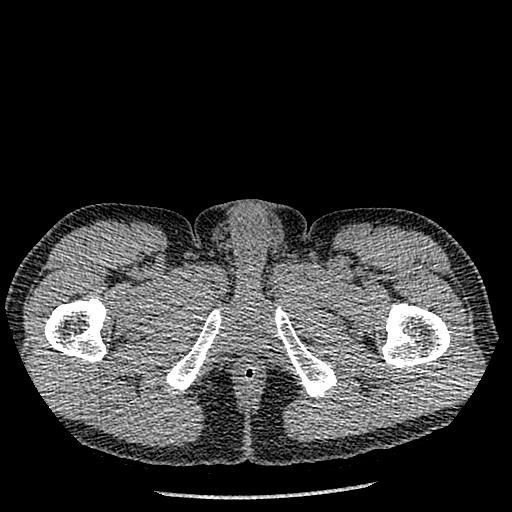
[im 52/414  bone]
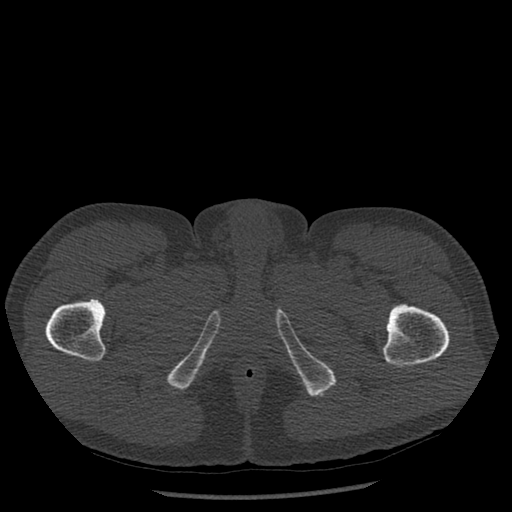
[im 104/414  soft-tissue]
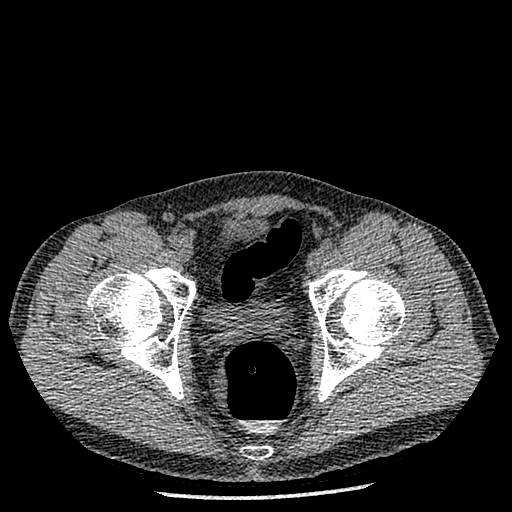
[im 155/414  soft-tissue]
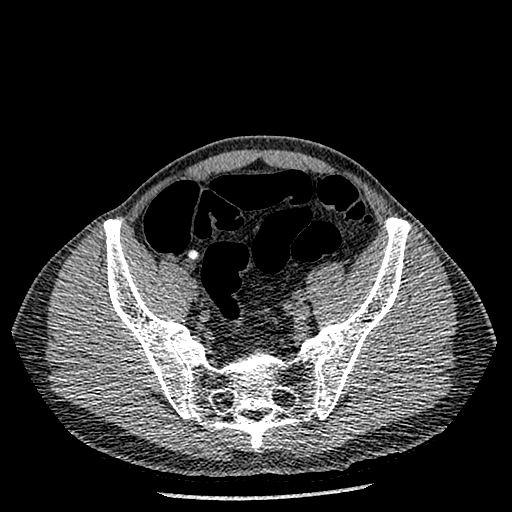
[im 207/414  soft-tissue]
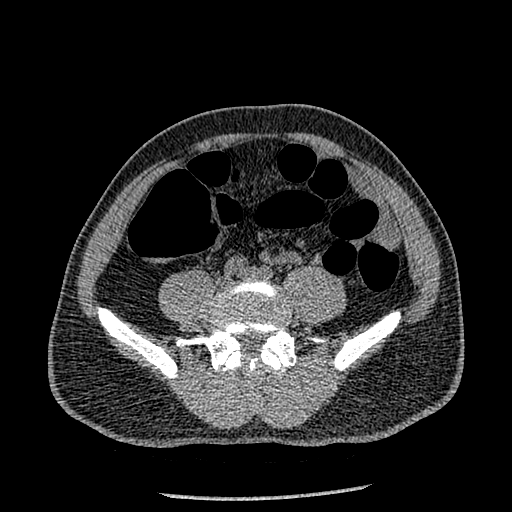
[im 207/414  lung]
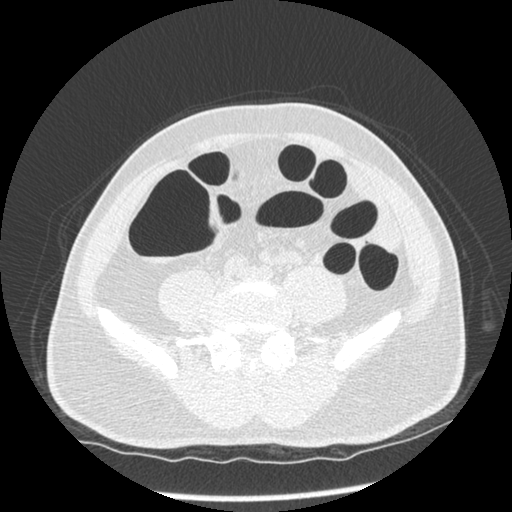
[im 259/414  soft-tissue]
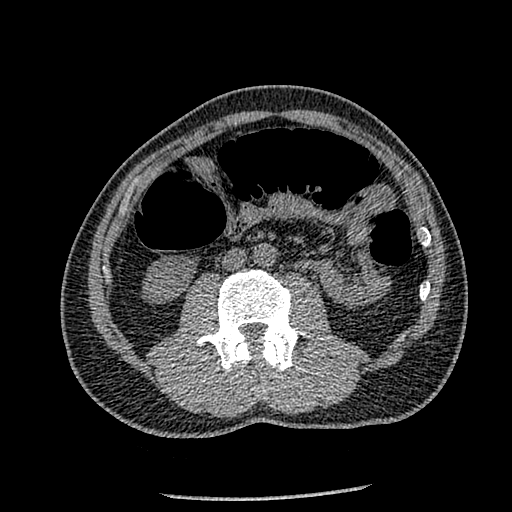
[im 259/414  lung]
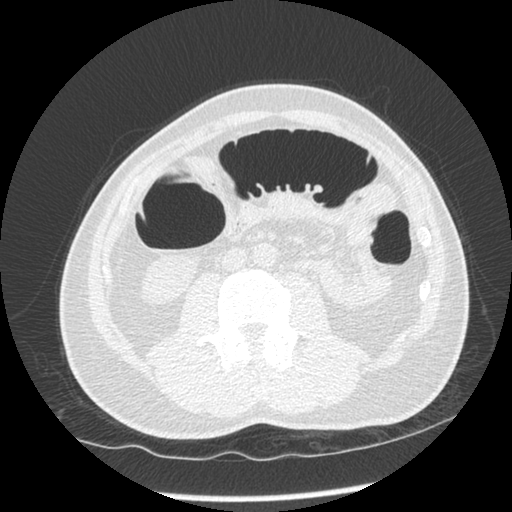
[im 310/414  soft-tissue]
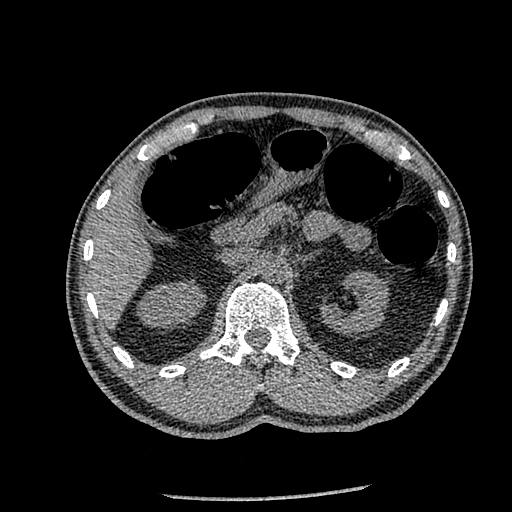
[im 310/414  lung]
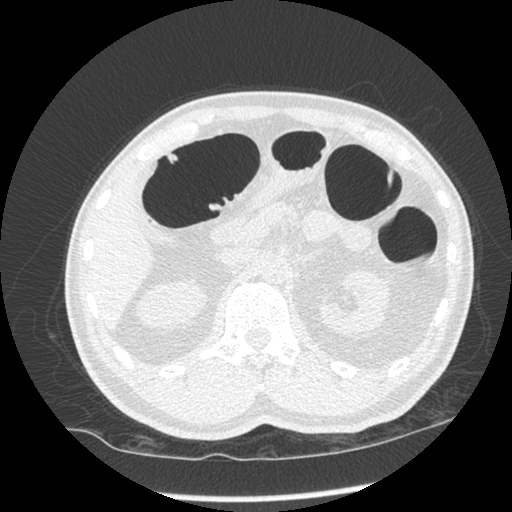
[im 362/414  soft-tissue]
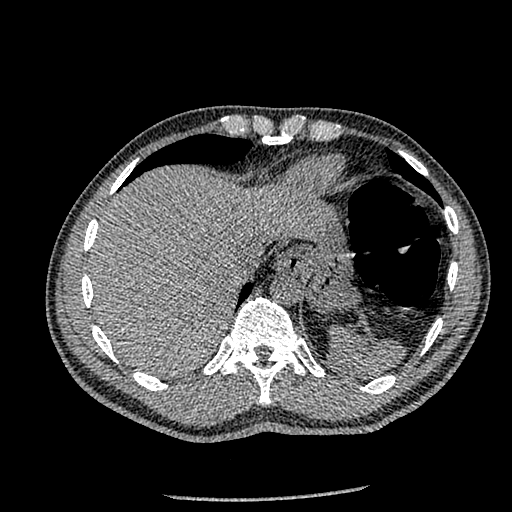
[im 362/414  lung]
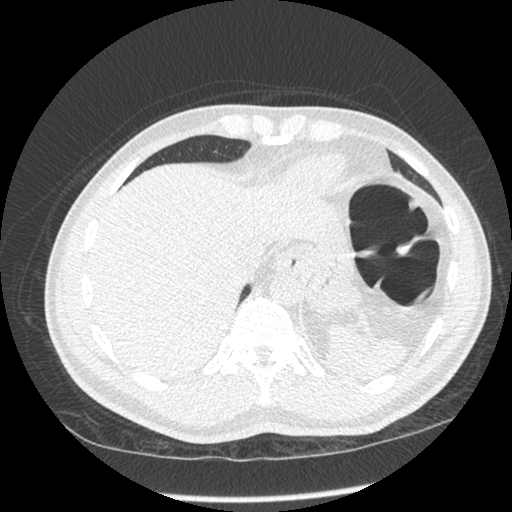

[Series 6: prone (id) · axial · 0.70mm/px · z∈[-480,-286]mm · 4 of 414 slices shown]
[im 52/414  soft-tissue]
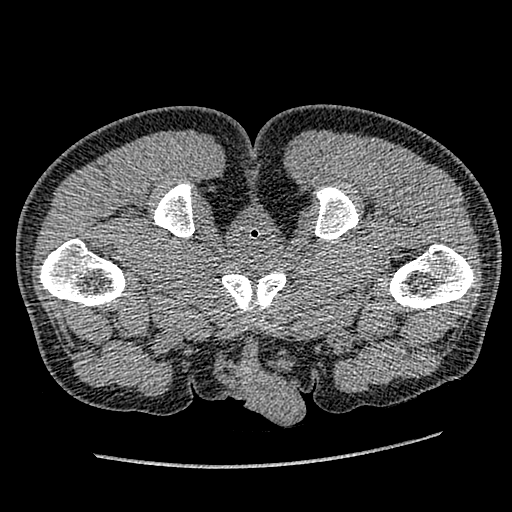
[im 104/414  soft-tissue]
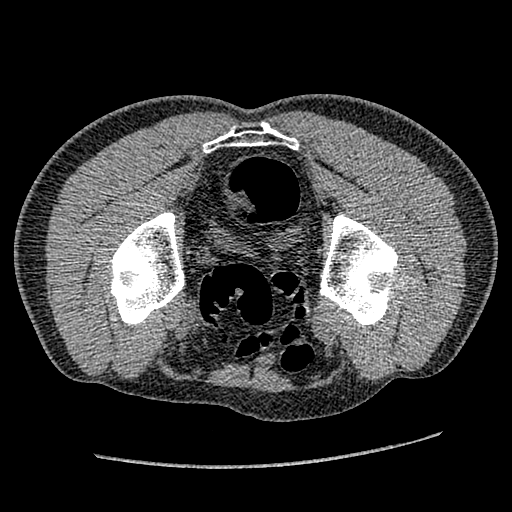
[im 155/414  soft-tissue]
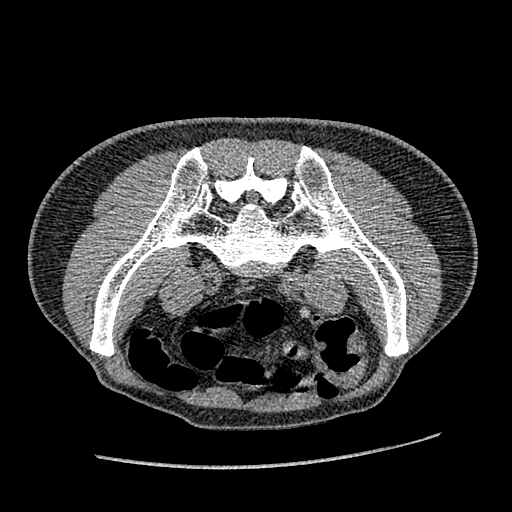
[im 207/414  soft-tissue]
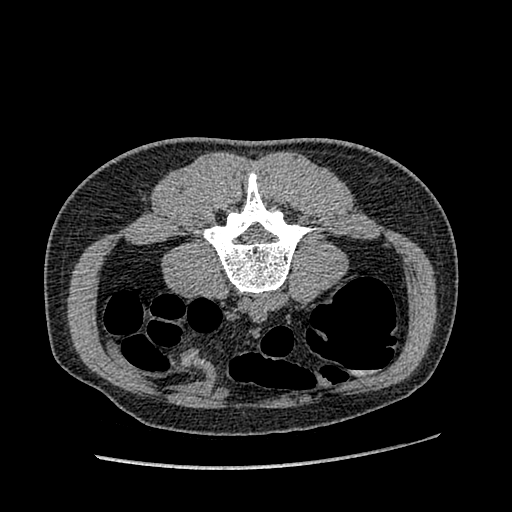

[Series 601: coronal body · coronal · 1.01mm/px · 1 of 115 slices shown, 2 images]
[im 39/115  soft-tissue]
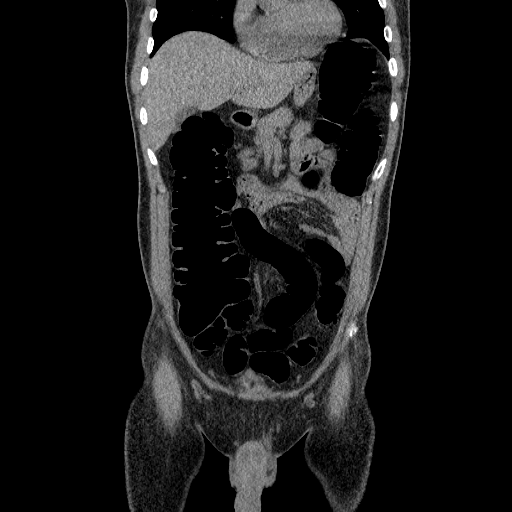
[im 39/115  bone]
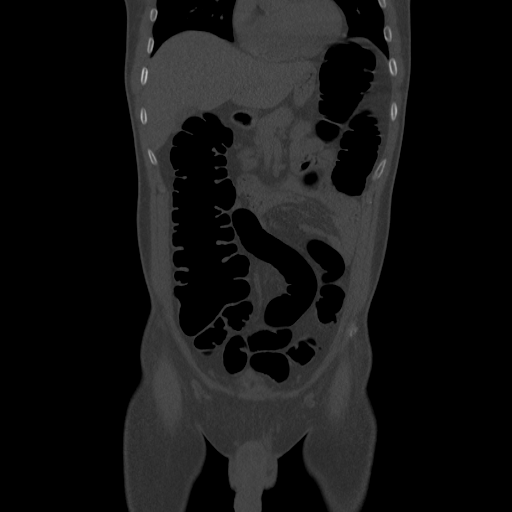

[12 of 36 positions shown; findings below may reference images not displayed]

FINDINGS: Scattered diverticula in the descending colon and sigmoid colon. No
fixed polypoid lesions. No annular constricting lesions.
IMPRESSION: Scattered diverticula. No visualized suspicious polypoid or
constricting lesions.

Virtual colonoscopy is not designed to detect diminutive polyps
(i.e., less than or equal to 5 mm), the presence or absence of which
may not affect clinical management.

CT ABDOMEN AND PELVIS WITHOUT CONTRAST
FINDINGS: Lower chest: Lung bases are clear. No effusions. Heart is normal
size.

Hepatobiliary: No focal hepatic abnormality. Gallbladder
unremarkable.

Pancreas: No focal abnormality or ductal dilatation.

Spleen: No focal abnormality.  Normal size.

Adrenals/Urinary Tract: 3 cm low-density area in the midpole of the
right kidney, the characterized without intravenous contrast but
most likely in 6. Ureters are decompressed as is the urinary
bladder, grossly unremarkable. Adrenal glands unremarkable.

Stomach/Bowel: Stomach and small bowel decompressed, unremarkable.
Appendix is normal.

Vascular/Lymphatic: No evidence of aneurysm or adenopathy.

Reproductive: No visible focal abnormality.

Other: No free fluid or free air.

Musculoskeletal: No acute bony abnormality or focal bone lesion.
IMPRESSION: 3 cm low-density area centrally in the midpole of the
right kidney, likely parapelvic cyst. This could be further
characterized with ultrasound or contrast-enhanced CT.

## 2017-07-29 DIAGNOSIS — Z125 Encounter for screening for malignant neoplasm of prostate: Secondary | ICD-10-CM | POA: Diagnosis not present

## 2017-07-29 DIAGNOSIS — E785 Hyperlipidemia, unspecified: Secondary | ICD-10-CM | POA: Diagnosis not present

## 2017-07-29 DIAGNOSIS — Z79899 Other long term (current) drug therapy: Secondary | ICD-10-CM | POA: Diagnosis not present

## 2017-07-29 DIAGNOSIS — I10 Essential (primary) hypertension: Secondary | ICD-10-CM | POA: Diagnosis not present

## 2017-07-30 LAB — BASIC METABOLIC PANEL
BUN / CREAT RATIO: 11 (ref 10–24)
BUN: 20 mg/dL (ref 8–27)
CO2: 23 mmol/L (ref 20–29)
CREATININE: 1.82 mg/dL — AB (ref 0.76–1.27)
Calcium: 9.9 mg/dL (ref 8.6–10.2)
Chloride: 103 mmol/L (ref 96–106)
GFR calc non Af Amer: 39 mL/min/{1.73_m2} — ABNORMAL LOW (ref >59)
GFR, EST AFRICAN AMERICAN: 45 mL/min/{1.73_m2} — AB (ref >59)
Glucose: 116 mg/dL — ABNORMAL HIGH (ref 65–99)
Potassium: 3.8 mmol/L (ref 3.5–5.2)
Sodium: 144 mmol/L (ref 134–144)

## 2017-07-30 LAB — HEPATIC FUNCTION PANEL
ALBUMIN: 4.6 g/dL (ref 3.6–4.8)
ALT: 20 IU/L (ref 0–44)
AST: 37 IU/L (ref 0–40)
Alkaline Phosphatase: 119 IU/L — ABNORMAL HIGH (ref 39–117)
BILIRUBIN TOTAL: 0.3 mg/dL (ref 0.0–1.2)
BILIRUBIN, DIRECT: 0.09 mg/dL (ref 0.00–0.40)
TOTAL PROTEIN: 6.9 g/dL (ref 6.0–8.5)

## 2017-07-30 LAB — LIPID PANEL
CHOL/HDL RATIO: 2.9 ratio (ref 0.0–5.0)
Cholesterol, Total: 232 mg/dL — ABNORMAL HIGH (ref 100–199)
HDL: 79 mg/dL (ref >39)
LDL Calculated: 141 mg/dL — ABNORMAL HIGH (ref 0–99)
Triglycerides: 59 mg/dL (ref 0–149)
VLDL Cholesterol Cal: 12 mg/dL (ref 5–40)

## 2017-07-30 LAB — PSA: Prostate Specific Ag, Serum: 1 ng/mL (ref 0.0–4.0)

## 2017-07-31 ENCOUNTER — Encounter: Payer: Self-pay | Admitting: Family Medicine

## 2017-07-31 ENCOUNTER — Ambulatory Visit: Payer: BLUE CROSS/BLUE SHIELD | Admitting: Family Medicine

## 2017-07-31 VITALS — BP 138/70 | Ht 69.5 in | Wt 153.0 lb

## 2017-07-31 DIAGNOSIS — N189 Chronic kidney disease, unspecified: Secondary | ICD-10-CM

## 2017-07-31 DIAGNOSIS — Z0000 Encounter for general adult medical examination without abnormal findings: Secondary | ICD-10-CM | POA: Diagnosis not present

## 2017-07-31 DIAGNOSIS — I10 Essential (primary) hypertension: Secondary | ICD-10-CM | POA: Diagnosis not present

## 2017-07-31 MED ORDER — HYDROCHLOROTHIAZIDE 25 MG PO TABS: ORAL_TABLET | ORAL | 5 refills | Status: DC

## 2017-07-31 MED ORDER — VERAPAMIL HCL ER 240 MG PO TBCR: 240.0000 mg | EXTENDED_RELEASE_TABLET | Freq: Every day | ORAL | 5 refills | Status: DC

## 2017-07-31 MED ORDER — ENALAPRIL MALEATE 20 MG PO TABS: 20.0000 mg | ORAL_TABLET | Freq: Two times a day (BID) | ORAL | 5 refills | Status: DC

## 2017-07-31 NOTE — Progress Notes (Signed)
   Subjective:    Patient ID: Charles Hurst, male    DOB: 18-Jul-1955, 62 y.o.   MRN: [id number]  HPI The patient comes in today for a wellness visit.  ++  A review of their health history was completed.  A review of medications was also completed.  Any needed refills; update all meds  Eating habits: health conscious  Falls/  MVA accidents in past few months: none  Regular exercise: walking  Specialist pt sees on regular basis: none  Preventative health issues were discussed.   Additional concerns: none  Watching diet well   Blood pressure medicine and blood pressure levels reviewed today with patient. Compliant with blood pressure medicine. States does not miss a dose. No obvious side effects. Blood pressure generally good when checked elsewhere. Watching salt intake.     Review of Systems  Constitutional: Negative for activity change, appetite change and fever.  HENT: Negative for congestion and rhinorrhea.   Eyes: Negative for discharge.  Respiratory: Negative for cough and wheezing.   Cardiovascular: Negative for chest pain.  Gastrointestinal: Negative for abdominal pain, blood in stool and vomiting.  Genitourinary: Negative for difficulty urinating and frequency.  Musculoskeletal: Negative for neck pain.  Skin: Negative for rash.  Allergic/Immunologic: Negative for environmental allergies and food allergies.  Neurological: Negative for weakness and headaches.  Psychiatric/Behavioral: Negative for agitation.  All other systems reviewed and are negative.      Objective:   Physical Exam  Constitutional: He appears well-developed and well-nourished.  HENT:  Head: Normocephalic and atraumatic.  Right Ear: External ear normal.  Left Ear: External ear normal.  Nose: Nose normal.  Mouth/Throat: Oropharynx is clear and moist.  Eyes: Pupils are equal, round, and reactive to light. EOM are normal.  Neck: Normal range of motion. Neck supple. No thyromegaly  present.  Cardiovascular: Normal rate, regular rhythm and normal heart sounds.  No murmur heard. Pulmonary/Chest: Effort normal and breath sounds normal. No respiratory distress. He has no wheezes.  Abdominal: Soft. Bowel sounds are normal. He exhibits no distension and no mass. There is no tenderness.  Genitourinary: Penis normal.  Musculoskeletal: Normal range of motion. He exhibits no edema.  Lymphadenopathy:    He has no cervical adenopathy.  Neurological: He is alert. He exhibits normal muscle tone.  Skin: Skin is warm and dry. No erythema.  Psychiatric: He has a normal mood and affect. His behavior is normal. Judgment normal.  Vitals reviewed.         Assessment & Plan:  1 impression wellness exam.  Last colonoscopy just a couple years ago.  Diet discussed.  Exercise discussed./Vaccines discussed patient declines  Hypertension control.  Maintain same meds rationale discussed/patient advised however medications may be changed upon visit to specialist considering problem #3  3.  Chronic renal insufficiency.  GFR 45 cc.  Discussed.  This is low.  Concerning with patient's relative youth/advised patient I think he should see the nephroloogist.  Rationale discussed.

## 2017-08-13 ENCOUNTER — Encounter (INDEPENDENT_AMBULATORY_CARE_PROVIDER_SITE_OTHER): Payer: Self-pay

## 2017-08-13 ENCOUNTER — Encounter: Payer: Self-pay | Admitting: Family Medicine

## 2017-09-22 DIAGNOSIS — I10 Essential (primary) hypertension: Secondary | ICD-10-CM | POA: Diagnosis not present

## 2017-09-22 DIAGNOSIS — N183 Chronic kidney disease, stage 3 (moderate): Secondary | ICD-10-CM | POA: Diagnosis not present

## 2017-09-29 ENCOUNTER — Other Ambulatory Visit (HOSPITAL_COMMUNITY): Payer: Self-pay | Admitting: Medical

## 2017-09-29 DIAGNOSIS — N183 Chronic kidney disease, stage 3 unspecified: Secondary | ICD-10-CM

## 2017-10-02 DIAGNOSIS — R809 Proteinuria, unspecified: Secondary | ICD-10-CM | POA: Diagnosis not present

## 2017-10-02 DIAGNOSIS — D509 Iron deficiency anemia, unspecified: Secondary | ICD-10-CM | POA: Diagnosis not present

## 2017-10-02 DIAGNOSIS — Z79899 Other long term (current) drug therapy: Secondary | ICD-10-CM | POA: Diagnosis not present

## 2017-10-02 DIAGNOSIS — N183 Chronic kidney disease, stage 3 (moderate): Secondary | ICD-10-CM | POA: Diagnosis not present

## 2017-10-02 DIAGNOSIS — E559 Vitamin D deficiency, unspecified: Secondary | ICD-10-CM | POA: Diagnosis not present

## 2017-10-09 ENCOUNTER — Ambulatory Visit (HOSPITAL_COMMUNITY)
Admission: RE | Admit: 2017-10-09 | Discharge: 2017-10-09 | Disposition: A | Discharge status: Home / Self Care | Payer: BLUE CROSS/BLUE SHIELD | Source: Ambulatory Visit | Attending: Medical | Admitting: Medical

## 2017-10-09 DIAGNOSIS — N183 Chronic kidney disease, stage 3 unspecified: Secondary | ICD-10-CM

## 2017-10-09 DIAGNOSIS — N281 Cyst of kidney, acquired: Secondary | ICD-10-CM | POA: Diagnosis not present

## 2017-10-21 ENCOUNTER — Telehealth: Payer: Self-pay | Admitting: Family Medicine

## 2017-10-21 MED ORDER — VERAPAMIL HCL ER 240 MG PO TBCR: 240.0000 mg | EXTENDED_RELEASE_TABLET | Freq: Every day | ORAL | 1 refills | Status: DC

## 2017-10-21 MED ORDER — HYDROCHLOROTHIAZIDE 25 MG PO TABS: ORAL_TABLET | ORAL | 1 refills | Status: DC

## 2017-10-21 MED ORDER — ENALAPRIL MALEATE 20 MG PO TABS: 20.0000 mg | ORAL_TABLET | Freq: Two times a day (BID) | ORAL | 1 refills | Status: DC

## 2017-10-21 NOTE — Telephone Encounter (Signed)
[facility]. Mardelle Mat[facility]y at [facility] is aware.

## 2017-10-21 NOTE — Telephone Encounter (Signed)
 pharmacy calling because insurance is requesting 90 day supplies on his Enalapril, Hydrochlorothiazide and Verapamil.  They have rx for 30, can we send a new one?

## 2017-10-24 DIAGNOSIS — I10 Essential (primary) hypertension: Secondary | ICD-10-CM | POA: Diagnosis not present

## 2017-10-24 DIAGNOSIS — R809 Proteinuria, unspecified: Secondary | ICD-10-CM | POA: Diagnosis not present

## 2017-10-24 DIAGNOSIS — N183 Chronic kidney disease, stage 3 (moderate): Secondary | ICD-10-CM | POA: Diagnosis not present

## 2017-10-27 ENCOUNTER — Encounter: Payer: Self-pay | Admitting: Family Medicine

## 2017-10-28 NOTE — Telephone Encounter (Signed)
Left message to return call to get more information 

## 2017-10-28 NOTE — Telephone Encounter (Signed)
Patient states he met with kidney specialist last week and he stated that his verapamil could be causing his kidney problems and to discuss this with his doctor.

## 2017-11-05 ENCOUNTER — Ambulatory Visit (HOSPITAL_COMMUNITY)
Admission: RE | Admit: 2017-11-05 | Discharge: 2017-11-05 | Disposition: A | Discharge status: Home / Self Care | Payer: BLUE CROSS/BLUE SHIELD | Source: Ambulatory Visit | Attending: Vascular Surgery | Admitting: Vascular Surgery

## 2017-11-05 ENCOUNTER — Other Ambulatory Visit (HOSPITAL_COMMUNITY): Payer: Self-pay | Admitting: Nephrology

## 2017-11-05 DIAGNOSIS — I10 Essential (primary) hypertension: Secondary | ICD-10-CM

## 2017-11-05 DIAGNOSIS — N183 Chronic kidney disease, stage 3 (moderate): Secondary | ICD-10-CM | POA: Diagnosis not present

## 2017-12-31 DIAGNOSIS — I10 Essential (primary) hypertension: Secondary | ICD-10-CM | POA: Diagnosis not present

## 2017-12-31 DIAGNOSIS — E559 Vitamin D deficiency, unspecified: Secondary | ICD-10-CM | POA: Diagnosis not present

## 2017-12-31 DIAGNOSIS — D509 Iron deficiency anemia, unspecified: Secondary | ICD-10-CM | POA: Diagnosis not present

## 2017-12-31 DIAGNOSIS — R809 Proteinuria, unspecified: Secondary | ICD-10-CM | POA: Diagnosis not present

## 2017-12-31 DIAGNOSIS — N183 Chronic kidney disease, stage 3 (moderate): Secondary | ICD-10-CM | POA: Diagnosis not present

## 2017-12-31 DIAGNOSIS — Z79899 Other long term (current) drug therapy: Secondary | ICD-10-CM | POA: Diagnosis not present

## 2018-01-06 DIAGNOSIS — I10 Essential (primary) hypertension: Secondary | ICD-10-CM | POA: Diagnosis not present

## 2018-01-06 DIAGNOSIS — N183 Chronic kidney disease, stage 3 (moderate): Secondary | ICD-10-CM | POA: Diagnosis not present

## 2018-01-06 DIAGNOSIS — E870 Hyperosmolality and hypernatremia: Secondary | ICD-10-CM | POA: Diagnosis not present

## 2018-01-06 DIAGNOSIS — E559 Vitamin D deficiency, unspecified: Secondary | ICD-10-CM | POA: Diagnosis not present

## 2018-01-30 ENCOUNTER — Ambulatory Visit: Payer: BLUE CROSS/BLUE SHIELD | Admitting: Family Medicine

## 2018-02-02 ENCOUNTER — Ambulatory Visit: Payer: BLUE CROSS/BLUE SHIELD | Admitting: Family Medicine

## 2018-02-02 ENCOUNTER — Encounter: Payer: Self-pay | Admitting: Family Medicine

## 2018-02-02 VITALS — BP 138/86 | Ht 69.5 in | Wt 158.2 lb

## 2018-02-02 DIAGNOSIS — I10 Essential (primary) hypertension: Secondary | ICD-10-CM

## 2018-02-02 MED ORDER — ENALAPRIL MALEATE 20 MG PO TABS: 20.0000 mg | ORAL_TABLET | Freq: Two times a day (BID) | ORAL | 1 refills | Status: DC

## 2018-02-02 MED ORDER — HYDROCHLOROTHIAZIDE 25 MG PO TABS: ORAL_TABLET | ORAL | 1 refills | Status: DC

## 2018-02-02 MED ORDER — VERAPAMIL HCL ER 240 MG PO TBCR: 240.0000 mg | EXTENDED_RELEASE_TABLET | Freq: Every day | ORAL | 1 refills | Status: DC

## 2018-02-02 NOTE — Progress Notes (Signed)
   Subjective:    Patient ID: Charles Hurst, male    DOB: 03-20-1955, 62 y.o.   MRN: [id number]  Hypertension  This is a chronic problem. There are no compliance problems.    Pt states everything is going well.   Blood pressure medicine and blood pressure levels reviewed today with patient. Compliant with blood pressure medicine. States does not miss a dose. No obvious side effects. Blood pressure generally good when checked elsewhere. Watching salt intake.   Exercising several times per wk, walking fair amnt  Takes meds faithfuly      Review of Systems No headache, no major weight loss or weight gain, no chest pain no back pain abdominal pain no change in bowel habits complete ROS otherwise negative     Objective:   Physical Exam Alert vitals stable, NAD. Blood pressure good on repeat. HEENT normal. Lungs clear. Heart regular rate and rhythm.   Impression hypertension.  Good control.  Discussed.  Diet exercise discussed.  Patient declines vaccines  2.  Chronic kidney disease.  Followed by specialist now.  Stable per patient and per nephrologist note  Follow-up in 6 months for wellness plus chronic       Assessment & Plan:

## 2018-08-04 ENCOUNTER — Encounter: Payer: Self-pay | Admitting: Family Medicine

## 2018-08-04 ENCOUNTER — Ambulatory Visit (INDEPENDENT_AMBULATORY_CARE_PROVIDER_SITE_OTHER): Payer: BC Managed Care – PPO | Admitting: Family Medicine

## 2018-08-04 ENCOUNTER — Other Ambulatory Visit: Payer: Self-pay

## 2018-08-04 DIAGNOSIS — I10 Essential (primary) hypertension: Secondary | ICD-10-CM

## 2018-08-04 MED ORDER — ENALAPRIL MALEATE 20 MG PO TABS: 20.0000 mg | ORAL_TABLET | Freq: Two times a day (BID) | ORAL | 1 refills | Status: DC

## 2018-08-04 MED ORDER — HYDROCHLOROTHIAZIDE 25 MG PO TABS: ORAL_TABLET | ORAL | 1 refills | Status: DC

## 2018-08-04 MED ORDER — VERAPAMIL HCL ER 240 MG PO TBCR: 240.0000 mg | EXTENDED_RELEASE_TABLET | Freq: Every day | ORAL | 1 refills | Status: DC

## 2018-08-04 NOTE — Progress Notes (Signed)
   Subjective:    Patient ID: Charles Hurst, male    DOB: Jul 16, 1955, 63 y.o.   MRN: [id number]  Hypertension  This is a chronic problem. The current episode started more than 1 year ago. Compliance problems: takes meds every day, eats healthy, exercises.   pt states when he checks bp it runs 130's over 80's  Pt states no conerns or problems today.  Virtual Visit via Telephone Note  I connected with Charles Hurst on 08/04/18 at 10:30 AM EDT by telephone and verified that I am speaking with the correct person using two identifiers.  Location: Patient: home  Provider: office   I discussed the limitations, risks, security and privacy concerns of performing an evaluation and management service by telephone and the availability of in person appointments. I also discussed with the patient that there may be a patient responsible charge related to this service. The patient expressed understanding and agreed to proceed.   History of Present Illness:    Observations/Objective:   Assessment and Plan:   Follow Up Instructions:    I discussed the assessment and treatment plan with the patient. The patient was provided an opportunity to ask questions and all were answered. The patient agreed with the plan and demonstrated an understanding of the instructions.   The patient was advised to call back or seek an in-person evaluation if the symptoms worsen or if the condition fails to improve as anticipated.  I provided  minutes of non-face-to-face time during this encounter.   Blood pressure medicine and blood pressure levels reviewed today with patient. Compliant with blood pressure medicine. States does not miss a dose. No obvious side effects. Blood pressure generally good when checked elsewhere. Watching salt intake.      Review of Systems No headache, no major weight loss or weight gain, no chest pain no back pain abdominal pain no change in bowel habits complete ROS otherwise  negative     Objective:   Physical Exam  Virtual      Assessment & Plan:  Impression hypertension.  Apparent good control.  Compliance discussed.  Diet exercise discussed.  Recheck in 3 months we will do wellness plus blood work then

## 2018-08-14 ENCOUNTER — Other Ambulatory Visit: Payer: Self-pay | Admitting: Family Medicine

## 2018-09-01 ENCOUNTER — Encounter: Payer: BC Managed Care – PPO | Admitting: Family Medicine

## 2018-10-27 ENCOUNTER — Encounter: Payer: Self-pay | Admitting: *Deleted

## 2018-10-27 ENCOUNTER — Ambulatory Visit (INDEPENDENT_AMBULATORY_CARE_PROVIDER_SITE_OTHER): Payer: BC Managed Care – PPO | Admitting: Family Medicine

## 2018-10-27 ENCOUNTER — Encounter: Payer: Self-pay | Admitting: Family Medicine

## 2018-10-27 ENCOUNTER — Other Ambulatory Visit: Payer: Self-pay

## 2018-10-27 VITALS — BP 132/78 | Temp 97.4°F | Ht 69.5 in | Wt 153.0 lb

## 2018-10-27 DIAGNOSIS — Z125 Encounter for screening for malignant neoplasm of prostate: Secondary | ICD-10-CM

## 2018-10-27 DIAGNOSIS — Z0000 Encounter for general adult medical examination without abnormal findings: Secondary | ICD-10-CM | POA: Diagnosis not present

## 2018-10-27 DIAGNOSIS — I10 Essential (primary) hypertension: Secondary | ICD-10-CM | POA: Diagnosis not present

## 2018-10-27 MED ORDER — ENALAPRIL MALEATE 20 MG PO TABS: 20.0000 mg | ORAL_TABLET | Freq: Two times a day (BID) | ORAL | 1 refills | Status: DC

## 2018-10-27 MED ORDER — HYDROCHLOROTHIAZIDE 25 MG PO TABS: ORAL_TABLET | ORAL | 1 refills | Status: DC

## 2018-10-27 MED ORDER — VERAPAMIL HCL ER 240 MG PO TBCR: 240.0000 mg | EXTENDED_RELEASE_TABLET | Freq: Every day | ORAL | 1 refills | Status: DC

## 2018-10-27 NOTE — Progress Notes (Signed)
Subjective:    Patient ID: Charles Hurst, male    DOB: January 30, 1956, 63 y.o.   MRN: [id number]  HPI The patient comes in today for a wellness visit.    A review of their health history was completed.  A review of medications was also completed.  Any needed refills;   Eating habits: health conscious  Falls/  MVA accidents in past few months: none  Regular exercise: yes  Specialist pt sees on regular basis: none  Preventative health issues were discussed.   Additional concerns: none  Declines flu vaccine.    Blood pressure medicine and blood pressure levels reviewed today with patient. Compliant with blood pressure medicine. States does not miss a dose. No obvious side effects. Blood pressure generally good when checked elsewhere. Watching salt intake. Notes syst generally around 130 dias in the low 70.  Patient states  Exercise going well    Diet  Well   nt a flu shot guy  Review of Systems  Constitutional: Negative for activity change, appetite change and fever.  HENT: Negative for congestion and rhinorrhea.   Eyes: Negative for discharge.  Respiratory: Negative for cough and wheezing.   Cardiovascular: Negative for chest pain.  Gastrointestinal: Negative for abdominal pain, blood in stool and vomiting.  Genitourinary: Negative for difficulty urinating and frequency.  Musculoskeletal: Negative for neck pain.  Skin: Negative for rash.  Allergic/Immunologic: Negative for environmental allergies and food allergies.  Neurological: Negative for weakness and headaches.  Psychiatric/Behavioral: Negative for agitation.  All other systems reviewed and are negative.      Objective:   Physical Exam Vitals signs reviewed.  Constitutional:      Appearance: He is well-developed.  HENT:     Head: Normocephalic and atraumatic.     Right Ear: External ear normal.     Left Ear: External ear normal.     Nose: Nose normal.  Eyes:     Pupils: Pupils are equal, round,  and reactive to light.  Neck:     Musculoskeletal: Normal range of motion and neck supple.     Thyroid: No thyromegaly.  Cardiovascular:     Rate and Rhythm: Normal rate and regular rhythm.     Heart sounds: Normal heart sounds. No murmur.  Pulmonary:     Effort: Pulmonary effort is normal. No respiratory distress.     Breath sounds: Normal breath sounds. No wheezing.  Abdominal:     General: Bowel sounds are normal. There is no distension.     Palpations: Abdomen is soft. There is no mass.     Tenderness: There is no abdominal tenderness.  Genitourinary:    Penis: Normal.   Musculoskeletal: Normal range of motion.  Lymphadenopathy:     Cervical: No cervical adenopathy.  Skin:    General: Skin is warm and dry.     Findings: No erythema.  Neurological:     Mental Status: He is alert.     Motor: No abnormal muscle tone.  Psychiatric:        Behavior: Behavior normal.        Judgment: Judgment normal.   Prostate within normal limits blood pressure excellent.        Assessment & Plan:  Impression wellness exam colon next due in 2022.  Diet discussed.  Exercise discussed.  Patient declines vaccines  2.  Hypertension.  Good control.  Discussed compliance discussed salt intake limitation encouraged maintain same dose of meds follow-up in 6 months

## 2019-04-01 ENCOUNTER — Encounter: Payer: Self-pay | Admitting: Family Medicine

## 2019-04-20 DIAGNOSIS — Z0000 Encounter for general adult medical examination without abnormal findings: Secondary | ICD-10-CM | POA: Diagnosis not present

## 2019-04-20 DIAGNOSIS — I10 Essential (primary) hypertension: Secondary | ICD-10-CM | POA: Diagnosis not present

## 2019-04-21 LAB — BASIC METABOLIC PANEL
BUN/Creatinine Ratio: 13 (ref 10–24)
BUN: 23 mg/dL (ref 8–27)
CO2: 22 mmol/L (ref 20–29)
Calcium: 9.8 mg/dL (ref 8.6–10.2)
Chloride: 101 mmol/L (ref 96–106)
Creatinine, Ser: 1.74 mg/dL — ABNORMAL HIGH (ref 0.76–1.27)
GFR calc Af Amer: 47 mL/min/{1.73_m2} — ABNORMAL LOW (ref >59)
GFR calc non Af Amer: 40 mL/min/{1.73_m2} — ABNORMAL LOW (ref >59)
Glucose: 120 mg/dL — ABNORMAL HIGH (ref 65–99)
Potassium: 3.8 mmol/L (ref 3.5–5.2)
Sodium: 141 mmol/L (ref 134–144)

## 2019-04-21 LAB — HEPATIC FUNCTION PANEL
ALT: 19 IU/L (ref 0–44)
AST: 27 IU/L (ref 0–40)
Albumin: 4.7 g/dL (ref 3.8–4.8)
Alkaline Phosphatase: 139 IU/L — ABNORMAL HIGH (ref 39–117)
Bilirubin Total: 0.3 mg/dL (ref 0.0–1.2)
Bilirubin, Direct: 0.1 mg/dL (ref 0.00–0.40)
Total Protein: 7.3 g/dL (ref 6.0–8.5)

## 2019-04-21 LAB — LIPID PANEL
Chol/HDL Ratio: 3 ratio (ref 0.0–5.0)
Cholesterol, Total: 245 mg/dL — ABNORMAL HIGH (ref 100–199)
HDL: 82 mg/dL (ref >39)
LDL Chol Calc (NIH): 152 mg/dL — ABNORMAL HIGH (ref 0–99)
Triglycerides: 66 mg/dL (ref 0–149)
VLDL Cholesterol Cal: 11 mg/dL (ref 5–40)

## 2019-04-21 LAB — PSA: Prostate Specific Ag, Serum: 0.8 ng/mL (ref 0.0–4.0)

## 2019-04-26 ENCOUNTER — Ambulatory Visit (INDEPENDENT_AMBULATORY_CARE_PROVIDER_SITE_OTHER): Payer: Medicare Other | Admitting: Family Medicine

## 2019-04-26 ENCOUNTER — Other Ambulatory Visit: Payer: Self-pay

## 2019-04-26 ENCOUNTER — Encounter: Payer: Self-pay | Admitting: Family Medicine

## 2019-04-26 DIAGNOSIS — N189 Chronic kidney disease, unspecified: Secondary | ICD-10-CM

## 2019-04-26 DIAGNOSIS — I10 Essential (primary) hypertension: Secondary | ICD-10-CM

## 2019-04-26 MED ORDER — ENALAPRIL MALEATE 20 MG PO TABS: 20.0000 mg | ORAL_TABLET | Freq: Two times a day (BID) | ORAL | 1 refills | Status: DC

## 2019-04-26 MED ORDER — VERAPAMIL HCL ER 240 MG PO TBCR: 240.0000 mg | EXTENDED_RELEASE_TABLET | Freq: Every day | ORAL | 1 refills | Status: DC

## 2019-04-26 MED ORDER — HYDROCHLOROTHIAZIDE 25 MG PO TABS: ORAL_TABLET | ORAL | 1 refills | Status: DC

## 2019-04-26 NOTE — Progress Notes (Signed)
Subjective:  Audio  Patient ID: Charles Hurst, male    DOB: [birth year], 64 y.o.   MRN: [id number]  Hypertension This is a chronic problem. There are no compliance problems.   Pt check blood pressure last week and it was 140/80; pt states no issues or concerns with blood pressure. Does not check often.   Virtual Visit via Telephone Note  I connected with Charles Hurst on 04/26/19 at 10:00 AM EST by telephone and verified that I am speaking with the correct person using two identifiers.  Location: Patient: home Provider: office   I discussed the limitations, risks, security and privacy concerns of performing an evaluation and management service by telephone and the availability of in person appointments. I also discussed with the patient that there may be a patient responsible charge related to this service. The patient expressed understanding and agreed to proceed.   History of Present Illness:    Observations/Objective:   Assessment and Plan:   Follow Up Instructions:    I discussed the assessment and treatment plan with the patient. The patient was provided an opportunity to ask questions and all were answered. The patient agreed with the plan and demonstrated an understanding of the instructions.   The patient was advised to call back or seek an in-person evaluation if the symptoms worsen or if the condition fails to improve as anticipated.  I provided  minutes of non-face-to-face time during this encounter.   Results for orders placed or performed in visit on 10/27/18  [facility] COLONOSCOPY  Result Value Ref Range   [facility] Colonoscopy Patient Reported See Report (in chart), Patient Reported   132 over 83  Results for orders placed or performed in visit on 10/27/18  [facility] COLONOSCOPY  Result Value Ref Range   [facility] Colonoscopy Patient Reported See Report (in chart), Patient Reported    Recent Results (from the past 2160 hour(s))  Hepatic function panel     Status: Abnormal    Collection Time: 04/20/19 10:43 AM  Result Value Ref Range   Total Protein 7.3 6.0 - 8.5 g/dL   Albumin 4.7 3.8 - 4.8 g/dL   Bilirubin Total 0.3 0.0 - 1.2 mg/dL   Bilirubin, Direct 0.10 0.00 - 0.40 mg/dL   Alkaline Phosphatase 139 (H) 39 - 117 IU/L   AST 27 0 - 40 IU/L   ALT 19 0 - 44 IU/L  Basic metabolic panel     Status: Abnormal   Collection Time: 04/20/19 10:43 AM  Result Value Ref Range   Glucose 120 (H) 65 - 99 mg/dL   BUN 23 8 - 27 mg/dL   Creatinine, Ser 1.74 (H) 0.76 - 1.27 mg/dL   GFR calc non Af Amer 40 (L) >59 mL/min/1.73   GFR calc Af Amer 47 (L) >59 mL/min/1.73   BUN/Creatinine Ratio 13 10 - 24   Sodium 141 134 - 144 mmol/L   Potassium 3.8 3.5 - 5.2 mmol/L   Chloride 101 96 - 106 mmol/L   CO2 22 20 - 29 mmol/L   Calcium 9.8 8.6 - 10.2 mg/dL  PSA     Status: None   Collection Time: 04/20/19 10:43 AM  Result Value Ref Range   Prostate Specific Ag, Serum 0.8 0.0 - 4.0 ng/mL    Comment: Roche ECLIA methodology. According to the American Urological Association, Serum PSA should decrease and remain at undetectable levels after radical prostatectomy. The AUA defines biochemical recurrence as an initial PSA value 0.2 ng/mL or greater  followed by a subsequent confirmatory PSA value 0.2 ng/mL or greater. Values obtained with different assay methods or kits cannot be used interchangeably. Results cannot be interpreted as absolute evidence of the presence or absence of malignant disease.   Lipid panel     Status: Abnormal   Collection Time: 04/20/19 10:43 AM  Result Value Ref Range   Cholesterol, Total 245 (H) 100 - 199 mg/dL   Triglycerides 66 0 - 149 mg/dL   HDL 82 >16 mg/dL   VLDL Cholesterol Cal 11 5 - 40 mg/dL   LDL Chol Calc (NIH) 010 (H) 0 - 99 mg/dL   Chol/HDL Ratio 3.0 0.0 - 5.0 ratio    Comment:                                   T. Chol/HDL Ratio                                             Men  Women                               1/2 Avg.Risk  3.4     3.3                                   Avg.Risk  5.0    4.4                                2X Avg.Risk  9.6    7.1                                3X Avg.Risk 23.4   11.0      Review of Systems No headache no chest pain no shortness of breath    Objective:   Physical Exam  Virtual      Assessment & Plan:  Impression #1 hypertension.  Apparent good control discussed to maintain same meds  2.  Hyperlipidemia.  HDL very strong.  Total cholesterol over HDL ratio still quite good.  Patient wishes to hold off medications will work on diet.  3.  Prediabetes.  Ongoing.  Fasting sugar this time at 120  4.  Chronic renal insufficiency.  Stable.  In fact slight improvement compared to last year/has seen in the nephrologist in this regard  Patient notes shoulder strain.  1 week duration.  Tylenol regularly with as needed use of Aleve okay short-term

## 2019-05-09 IMAGING — US US RENAL
1 series · 14 of 25 positions shown · non-contrast
Comparison: January 22, 2016

CLINICAL DATA: Evaluate mass seen on recent CT scan.

EXAM:
RENAL / URINARY TRACT ULTRASOUND COMPLETE

[Series 1: us renal · 0.21mm/px · 14 of 42 slices shown]
[im 1/42]
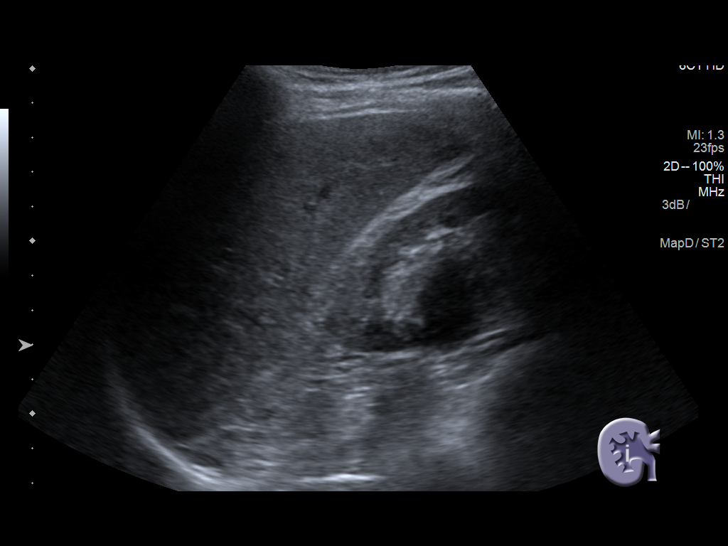
[im 4/42]
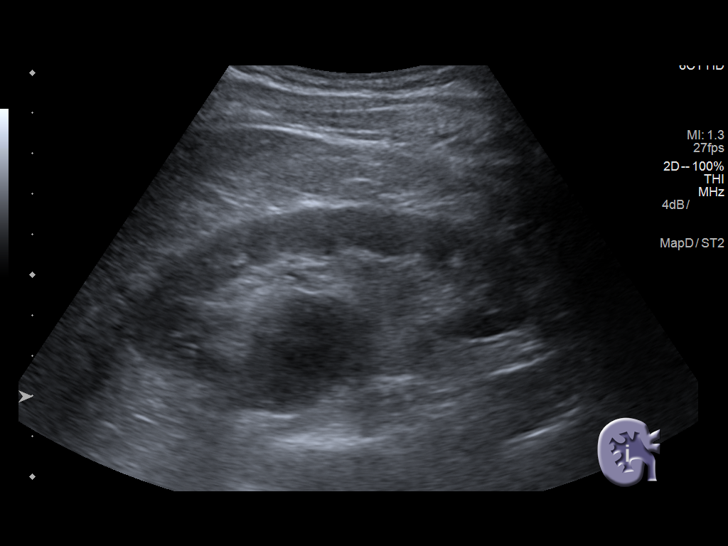
[im 7/42]
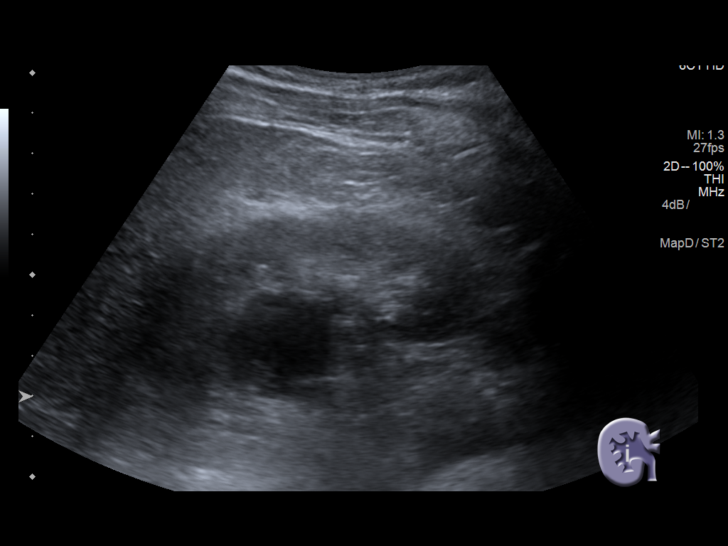
[im 11/42]
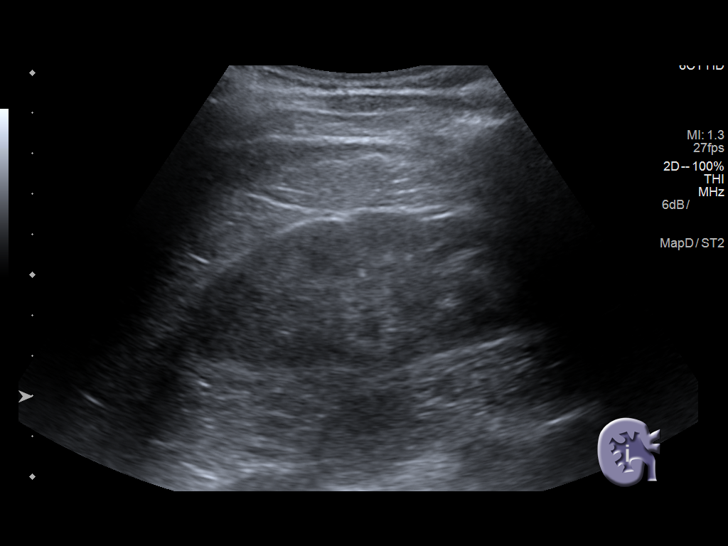
[im 14/42]
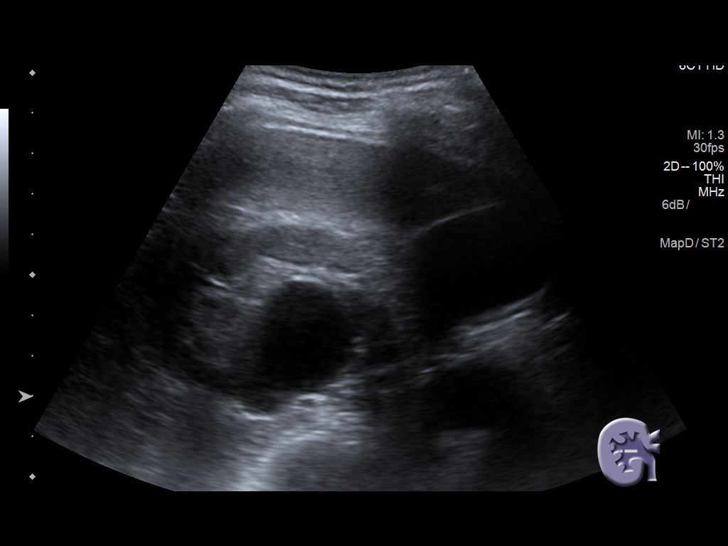
[im 16/42]
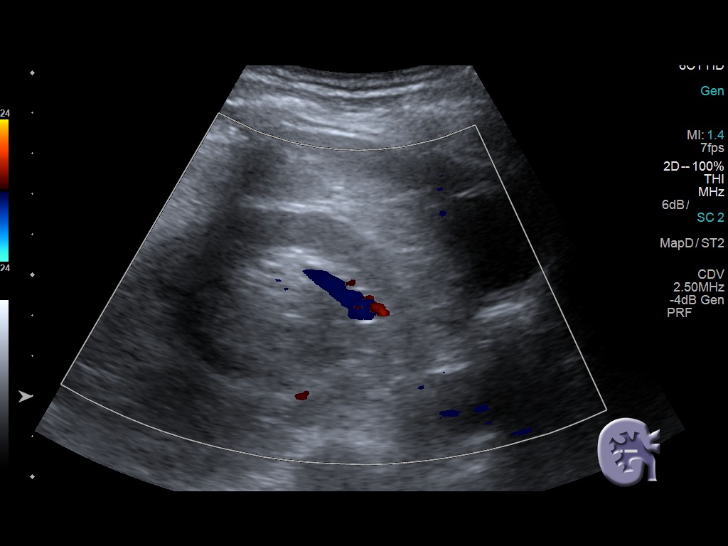
[im 19/42]
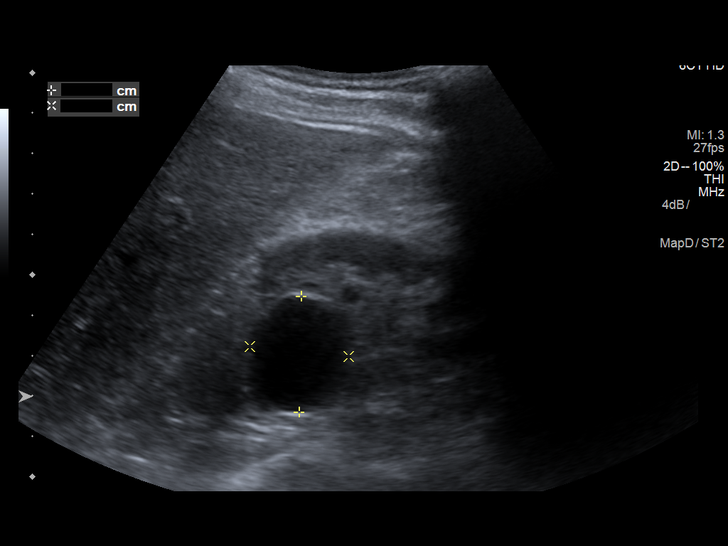
[im 23/42]
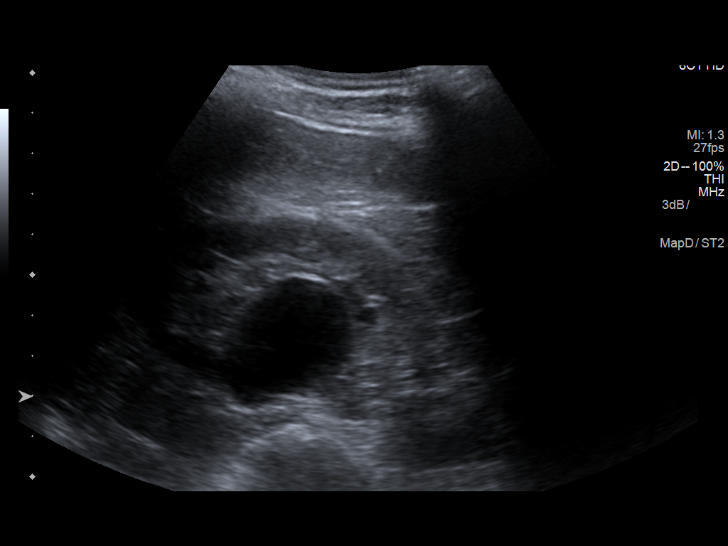
[im 26/42]
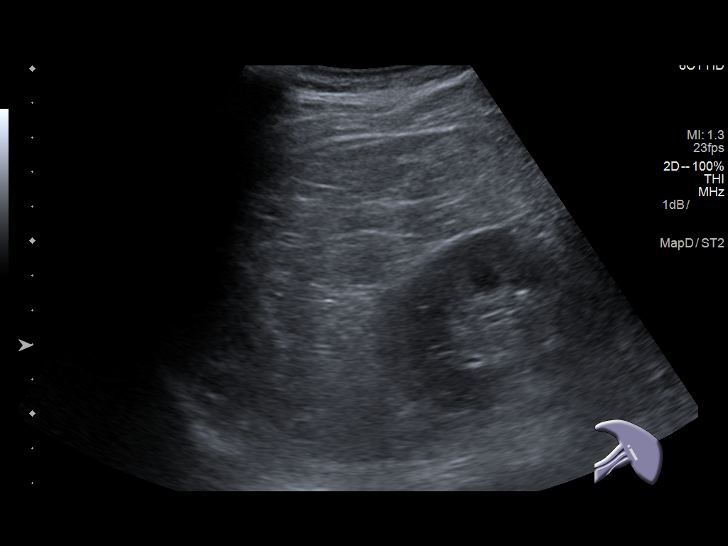
[im 28/42]
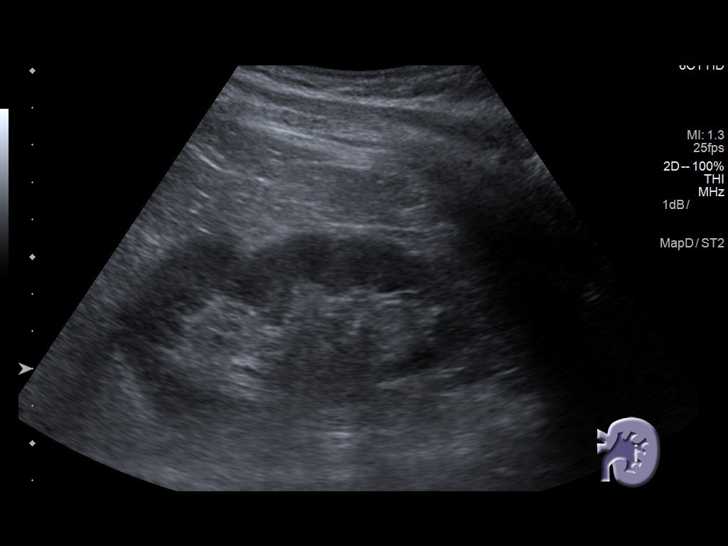
[im 31/42]
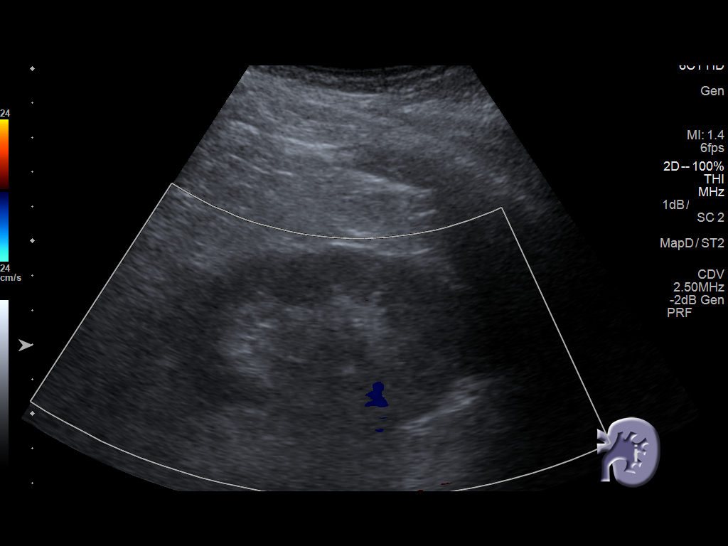
[im 35/42]
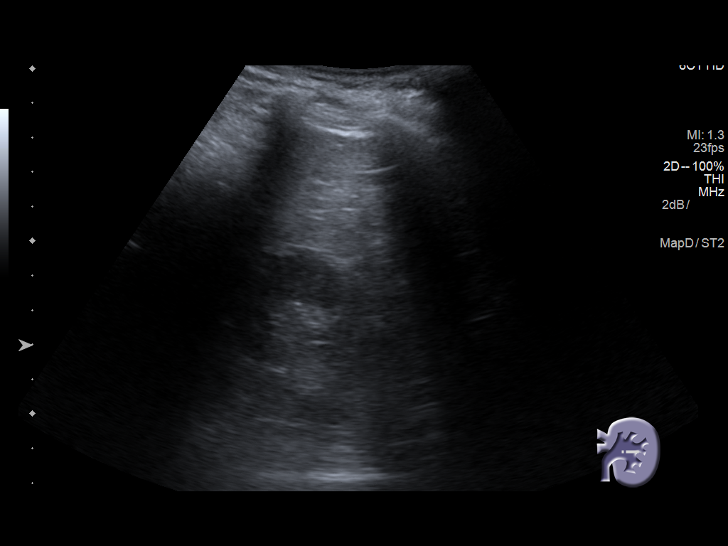
[im 38/42]
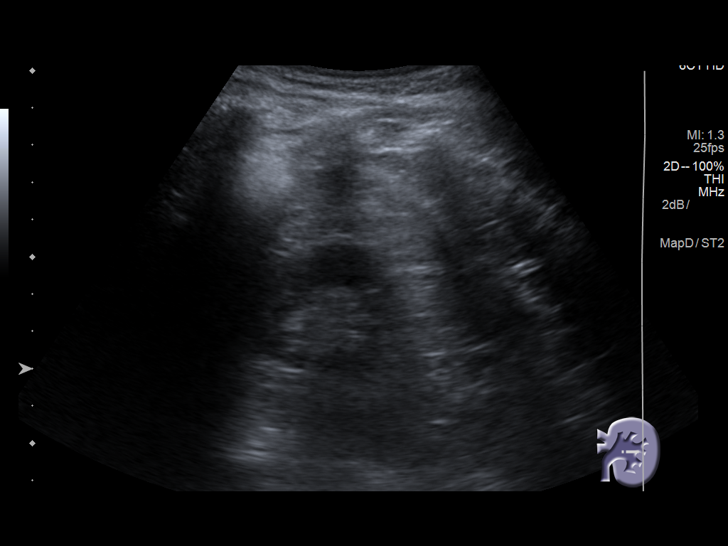
[im 42/42]
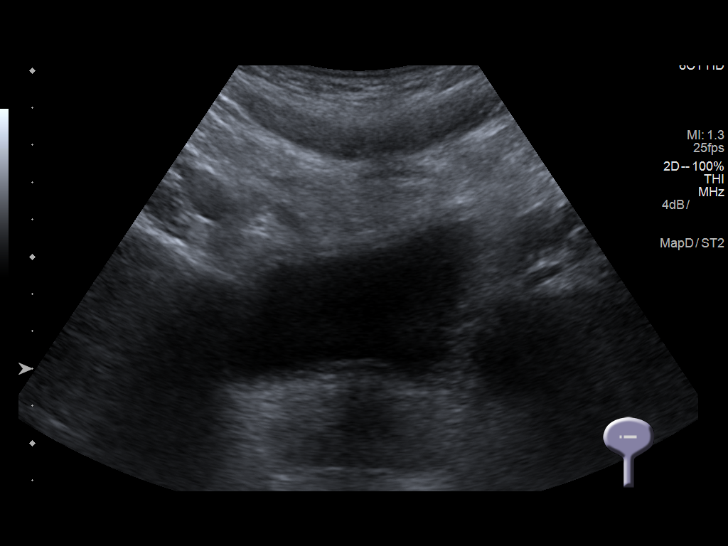

[14 of 25 positions shown; findings below may reference images not displayed]

FINDINGS: Right Kidney:

Length: 10.4 cm. The mass seen in the central right kidney on the
recent CT scan is cystic on today's study. Also met criteria for a
cyst on the recent CT. The mass measures 2.9 x 2.5 x 3.0 cm.

Left Kidney:

Length: 9.9 cm. Echogenicity within normal limits. No mass or
hydronephrosis visualized.

Bladder:

Appears normal for degree of bladder distention.
IMPRESSION: Right renal cyst.

## 2019-09-27 ENCOUNTER — Telehealth: Payer: Self-pay | Admitting: Family Medicine

## 2019-09-27 MED ORDER — VERAPAMIL HCL ER 240 MG PO TBCR: 240.0000 mg | EXTENDED_RELEASE_TABLET | Freq: Every day | ORAL | 0 refills | Status: DC

## 2019-09-27 NOTE — Telephone Encounter (Signed)
Pt has a 6 month BP med follow up on 11/03/2019 - will need refill before then, can we send to [facility] verapamil (CALAN-SR) 240 MG CR tablet  Pt states this is the only daily medicine he's on   Please advise & call pt when done

## 2019-09-27 NOTE — Telephone Encounter (Signed)
Prescription sent electronically to pharmacy. Patient notified. 

## 2019-11-03 ENCOUNTER — Telehealth: Payer: Self-pay | Admitting: Family Medicine

## 2019-11-03 ENCOUNTER — Ambulatory Visit: Payer: Medicare Other | Admitting: Family Medicine

## 2019-11-03 NOTE — Telephone Encounter (Signed)
Patient had appointment today for medication follow up but provider was out and needing refill on verapamil 240 mg he will be out before his next appointment on 9/29. Please advise [facility]

## 2019-11-04 MED ORDER — VERAPAMIL HCL ER 240 MG PO TBCR: 240.0000 mg | EXTENDED_RELEASE_TABLET | Freq: Every day | ORAL | 0 refills | Status: DC

## 2019-11-05 NOTE — Telephone Encounter (Signed)
Pt.notified

## 2019-11-24 ENCOUNTER — Encounter: Payer: Self-pay | Admitting: Family Medicine

## 2019-11-24 ENCOUNTER — Ambulatory Visit (INDEPENDENT_AMBULATORY_CARE_PROVIDER_SITE_OTHER): Payer: Medicare Other | Admitting: Family Medicine

## 2019-11-24 ENCOUNTER — Other Ambulatory Visit: Payer: Self-pay

## 2019-11-24 VITALS — BP 138/78 | HR 66 | Temp 97.4°F | Ht 69.5 in | Wt 155.4 lb

## 2019-11-24 DIAGNOSIS — I10 Essential (primary) hypertension: Secondary | ICD-10-CM

## 2019-11-24 DIAGNOSIS — E785 Hyperlipidemia, unspecified: Secondary | ICD-10-CM | POA: Diagnosis not present

## 2019-11-24 DIAGNOSIS — R7301 Impaired fasting glucose: Secondary | ICD-10-CM | POA: Diagnosis not present

## 2019-11-24 DIAGNOSIS — N183 Chronic kidney disease, stage 3 unspecified: Secondary | ICD-10-CM

## 2019-11-24 MED ORDER — ENALAPRIL MALEATE 20 MG PO TABS: 20.0000 mg | ORAL_TABLET | Freq: Two times a day (BID) | ORAL | 0 refills | Status: DC

## 2019-11-24 MED ORDER — HYDROCHLOROTHIAZIDE 25 MG PO TABS: ORAL_TABLET | ORAL | 0 refills | Status: DC

## 2019-11-24 NOTE — Progress Notes (Signed)
Patient ID: Charles Hurst, male    DOB: 15-Dec-1955, 64 y.o.   MRN: [id number]   Chief Complaint  Patient presents with  . Hypertension   Subjective:    HPI   Pt here for follow up on HTN. No issues . Checking blood pressure at least once a week. Has h/o CKD stage 3, seen by nephrology in past.  2/21-Slight increase in Cr at 1.74 in past. Elevated cholesterol in past. Has inc water and dec soda.  Used to work in [place]. Not having any SE with meds. Staying active with yard work. Saw kidney doctor about 2019-  Stage 3 ckd- secondary htn.  PA, Lyles from nephrology, hasn't followed up since 2019. Pt stating compliant with meds.  No CP, sob, HA, LE edema, or dizziness.  Medical History El[facility]o has no past medical history on file.   Outpatient Encounter Medications as of 11/24/2019  Medication Sig  . enalapril (VASOTEC) 20 MG tablet Take 1 tablet (20 mg total) by mouth 2 (two) times daily. Needs labs before more refills.  . hydrochlorothiazide (HYDRODIURIL) 25 MG tablet TAKE ONE (1) TABLET BY MOUTH EVERY DAY.  Needs labs before more refills.  Marland Kitchen PROCTOSOL HC 2.5 % rectal cream APPLY RECTALLY TWICE A DAY  . verapamil (CALAN-SR) 240 MG CR tablet Take 1 tablet (240 mg total) by mouth at bedtime.  . [DISCONTINUED] enalapril (VASOTEC) 20 MG tablet Take 1 tablet (20 mg total) by mouth 2 (two) times daily.  . [DISCONTINUED] hydrochlorothiazide (HYDRODIURIL) 25 MG tablet TAKE ONE (1) TABLET BY MOUTH EVERY DAY   No facility-administered encounter medications on file as of 11/24/2019.     Review of Systems  Constitutional: Negative for chills and fever.  HENT: Negative for congestion, rhinorrhea and sore throat.   Respiratory: Negative for cough, shortness of breath and wheezing.   Cardiovascular: Negative for chest pain and leg swelling.  Gastrointestinal: Negative for abdominal pain, diarrhea, nausea and vomiting.  Genitourinary: Negative for dysuria and frequency.  Skin:  Negative for rash.  Neurological: Negative for dizziness, weakness and headaches.     Vitals BP 138/78   Pulse 66   Temp (!) 97.4 F (36.3 C)   Ht 5' 9.5" (1.765 m)   Wt 155 lb 6.4 oz (70.5 kg)   SpO2 100%   BMI 22.62 kg/m   Objective:   Physical Exam Vitals and nursing note reviewed.  Constitutional:      General: He is not in acute distress.    Appearance: Normal appearance. He is not ill-appearing.  HENT:     Head: Normocephalic and atraumatic.  Cardiovascular:     Rate and Rhythm: Normal rate and regular rhythm.     Pulses: Normal pulses.     Heart sounds: Normal heart sounds. No murmur heard.   Pulmonary:     Effort: Pulmonary effort is normal. No respiratory distress.     Breath sounds: Normal breath sounds. No wheezing.  Musculoskeletal:        General: Normal range of motion.     Right lower leg: No edema.     Left lower leg: No edema.  Skin:    General: Skin is warm and dry.     Findings: No rash.  Neurological:     General: No focal deficit present.     Mental Status: He is alert and oriented to person, place, and time.     Cranial Nerves: No cranial nerve deficit.  Psychiatric:  Mood and Affect: Mood normal.        Behavior: Behavior normal.        Thought Content: Thought content normal.        Judgment: Judgment normal.      Assessment and Plan   1. Essential hypertension, benign - CBC - CMP14+EGFR - Lipid panel  2. Stage 3 chronic kidney disease, unspecified whether stage 3a or 3b CKD ([place]) - CMP14+EGFR  3. Hyperlipidemia, unspecified hyperlipidemia type  4. Impaired fasting glucose    htn- stable, cont to monitor salt nad cont meds. Will recheck labs.  Gave small amt meds till pt can get labs.  Pt declining flu vaccine today. Needs refill after getting labs.  Will recheck glucose and cholesterol. Pt not taking medications for HLD. Last labs 2/21- reviewed.  F/u [address] prn.

## 2020-01-12 ENCOUNTER — Other Ambulatory Visit: Payer: Self-pay | Admitting: Family Medicine

## 2020-02-10 ENCOUNTER — Other Ambulatory Visit: Payer: Self-pay | Admitting: *Deleted

## 2020-02-10 MED ORDER — HYDROCHLOROTHIAZIDE 25 MG PO TABS: ORAL_TABLET | ORAL | 0 refills | Status: DC

## 2020-02-22 DIAGNOSIS — I10 Essential (primary) hypertension: Secondary | ICD-10-CM | POA: Diagnosis not present

## 2020-02-22 DIAGNOSIS — R7989 Other specified abnormal findings of blood chemistry: Secondary | ICD-10-CM | POA: Diagnosis not present

## 2020-02-23 LAB — CMP14+EGFR
ALT: 32 IU/L (ref 0–44)
AST: 38 IU/L (ref 0–40)
Albumin/Globulin Ratio: 1.8 (ref 1.2–2.2)
Albumin: 4.7 g/dL (ref 3.8–4.8)
Alkaline Phosphatase: 156 IU/L — ABNORMAL HIGH (ref 44–121)
BUN/Creatinine Ratio: 13 (ref 10–24)
BUN: 18 mg/dL (ref 8–27)
Bilirubin Total: 0.4 mg/dL (ref 0.0–1.2)
CO2: 25 mmol/L (ref 20–29)
Calcium: 10.1 mg/dL (ref 8.6–10.2)
Chloride: 101 mmol/L (ref 96–106)
Creatinine, Ser: 1.42 mg/dL — ABNORMAL HIGH (ref 0.76–1.27)
GFR calc Af Amer: 59 mL/min/{1.73_m2} — ABNORMAL LOW (ref >59)
GFR calc non Af Amer: 51 mL/min/{1.73_m2} — ABNORMAL LOW (ref >59)
Globulin, Total: 2.6 g/dL (ref 1.5–4.5)
Glucose: 113 mg/dL — ABNORMAL HIGH (ref 65–99)
Potassium: 3.8 mmol/L (ref 3.5–5.2)
Sodium: 141 mmol/L (ref 134–144)
Total Protein: 7.3 g/dL (ref 6.0–8.5)

## 2020-02-23 LAB — CBC
Hematocrit: 44.1 % (ref 37.5–51.0)
Hemoglobin: 14.9 g/dL (ref 13.0–17.7)
MCH: 33 pg (ref 26.6–33.0)
MCHC: 33.8 g/dL (ref 31.5–35.7)
MCV: 98 fL — ABNORMAL HIGH (ref 79–97)
Platelets: 285 10*3/uL (ref 150–450)
RBC: 4.52 x10E6/uL (ref 4.14–5.80)
RDW: 12.8 % (ref 11.6–15.4)
WBC: 5.1 10*3/uL (ref 3.4–10.8)

## 2020-02-23 LAB — LIPID PANEL
Chol/HDL Ratio: 2.8 ratio (ref 0.0–5.0)
Cholesterol, Total: 266 mg/dL — ABNORMAL HIGH (ref 100–199)
HDL: 95 mg/dL (ref >39)
LDL Chol Calc (NIH): 164 mg/dL — ABNORMAL HIGH (ref 0–99)
Triglycerides: 47 mg/dL (ref 0–149)
VLDL Cholesterol Cal: 7 mg/dL (ref 5–40)

## 2020-02-24 ENCOUNTER — Other Ambulatory Visit: Payer: Self-pay | Admitting: Family Medicine

## 2020-02-25 MED ORDER — SIMVASTATIN 20 MG PO TABS: 20.0000 mg | ORAL_TABLET | Freq: Every day | ORAL | 1 refills | Status: DC

## 2020-02-25 NOTE — Telephone Encounter (Signed)
-----   Message from Charles Shores, LPN sent at [phone] 11:12 AM EST ----- Pt contacted and verbalized understanding. Pt is willing to try a statin. Pt would also like refill on hydrochlorothiazide also. Please advise. Thank you  [facility]

## 2020-03-29 ENCOUNTER — Other Ambulatory Visit: Payer: Self-pay | Admitting: Family Medicine

## 2020-03-29 ENCOUNTER — Telehealth: Payer: Self-pay | Admitting: Family Medicine

## 2020-03-29 MED ORDER — ENALAPRIL MALEATE 20 MG PO TABS: 20.0000 mg | ORAL_TABLET | Freq: Two times a day (BID) | ORAL | 0 refills | Status: DC

## 2020-03-29 NOTE — Telephone Encounter (Signed)
Prescription sent electronically to pharmacy. Patient notified. 

## 2020-03-29 NOTE — Telephone Encounter (Signed)
Patient is requesting refill enalapril 20 mg called into [facility]

## 2020-05-06 ENCOUNTER — Other Ambulatory Visit: Payer: Self-pay | Admitting: Family Medicine

## 2020-05-06 DIAGNOSIS — I10 Essential (primary) hypertension: Secondary | ICD-10-CM

## 2020-05-06 DIAGNOSIS — Z79899 Other long term (current) drug therapy: Secondary | ICD-10-CM

## 2020-05-06 DIAGNOSIS — E785 Hyperlipidemia, unspecified: Secondary | ICD-10-CM

## 2020-05-06 DIAGNOSIS — N189 Chronic kidney disease, unspecified: Secondary | ICD-10-CM

## 2020-05-09 NOTE — Addendum Note (Signed)
Addended by: Marlowe Shores on: 05/09/2020 12:00 PM   Modules accepted: Orders

## 2020-05-09 NOTE — Telephone Encounter (Signed)
Lab orders placed and mailed to patient  

## 2020-05-09 NOTE — Telephone Encounter (Signed)
Lab Results  Component Value Date   HGBA1C 5.5 06/27/2015    Lab Results  Component Value Date   CREATININE 1.42 (H) 02/22/2020     Lab Results  Component Value Date   CHOL 266 (H) 02/22/2020   HDL 95 02/22/2020   LDLCALC 164 (H) 02/22/2020   TRIG 47 02/22/2020   CHOLHDL 2.8 02/22/2020     BP Readings from Last 3 Encounters:  11/24/19 138/78  10/27/18 132/78  02/02/18 138/86

## 2020-05-09 NOTE — Telephone Encounter (Signed)
Needs f/u and labs in next 3 mnths.  Labs pls ord cbc, cmp, lpids.   Thx. Dr. Karie Schwalbe

## 2020-05-23 ENCOUNTER — Other Ambulatory Visit: Payer: Self-pay | Admitting: Family Medicine

## 2020-05-25 ENCOUNTER — Telehealth: Payer: Self-pay

## 2020-05-25 MED ORDER — HYDROCHLOROTHIAZIDE 25 MG PO TABS: ORAL_TABLET | ORAL | 0 refills | Status: DC

## 2020-05-25 NOTE — Telephone Encounter (Signed)
Prescription sent electronically to pharmacy. Patient notified. 

## 2020-05-25 NOTE — Telephone Encounter (Signed)
Pt needs refill on hydrochlorothiazide (HYDRODIURIL) 25 MG tablet  [facility] - [place], [place] - [address]   Pt call back [phone]

## 2020-06-02 ENCOUNTER — Other Ambulatory Visit: Payer: Self-pay | Admitting: Family Medicine

## 2020-06-05 NOTE — Telephone Encounter (Signed)
Lab Results  Component Value Date   HGBA1C 5.5 06/27/2015    Lab Results  Component Value Date   CREATININE 1.42 (H) 02/22/2020     Lab Results  Component Value Date   CHOL 266 (H) 02/22/2020   HDL 95 02/22/2020   LDLCALC 164 (H) 02/22/2020   TRIG 47 02/22/2020   CHOLHDL 2.8 02/22/2020     BP Readings from Last 3 Encounters:  11/24/19 138/78  10/27/18 132/78  02/02/18 138/86     

## 2020-06-27 DIAGNOSIS — Z20822 Contact with and (suspected) exposure to covid-19: Secondary | ICD-10-CM | POA: Diagnosis not present

## 2020-07-08 ENCOUNTER — Other Ambulatory Visit: Payer: Self-pay | Admitting: Family Medicine

## 2020-07-11 NOTE — Telephone Encounter (Signed)
Please contact patient to have him schedule appt in next 30 days. Thank you

## 2020-07-11 NOTE — Telephone Encounter (Signed)
Needs follow up and recheck labs in next 30 days. Thx. Dr. Ladona Ridgel

## 2020-07-11 NOTE — Telephone Encounter (Signed)
Sent mychart message

## 2020-07-11 NOTE — Telephone Encounter (Signed)
Lab Results  Component Value Date   HGBA1C 5.5 06/27/2015    Lab Results  Component Value Date   CREATININE 1.42 (H) 02/22/2020     Lab Results  Component Value Date   CHOL 266 (H) 02/22/2020   HDL 95 02/22/2020   LDLCALC 164 (H) 02/22/2020   TRIG 47 02/22/2020   CHOLHDL 2.8 02/22/2020     BP Readings from Last 3 Encounters:  11/24/19 138/78  10/27/18 132/78  02/02/18 138/86

## 2020-08-09 ENCOUNTER — Other Ambulatory Visit: Payer: Self-pay

## 2020-08-09 NOTE — Telephone Encounter (Signed)
Please contact patient to have him set up appt. Thank you! 

## 2020-08-14 MED ORDER — ENALAPRIL MALEATE 20 MG PO TABS: ORAL_TABLET | ORAL | 0 refills | Status: DC

## 2020-08-14 NOTE — Telephone Encounter (Signed)
Patient made appointment for 6/30 for medication follow up but needs medication called in will be out by tomorrow

## 2020-08-24 ENCOUNTER — Ambulatory Visit (INDEPENDENT_AMBULATORY_CARE_PROVIDER_SITE_OTHER): Payer: Medicare Other | Admitting: Family Medicine

## 2020-08-24 ENCOUNTER — Other Ambulatory Visit: Payer: Self-pay

## 2020-08-24 VITALS — BP 142/70 | HR 82 | Temp 97.7°F | Ht 69.5 in | Wt 159.0 lb

## 2020-08-24 DIAGNOSIS — R7301 Impaired fasting glucose: Secondary | ICD-10-CM | POA: Diagnosis not present

## 2020-08-24 DIAGNOSIS — Z125 Encounter for screening for malignant neoplasm of prostate: Secondary | ICD-10-CM

## 2020-08-24 DIAGNOSIS — N183 Chronic kidney disease, stage 3 unspecified: Secondary | ICD-10-CM

## 2020-08-24 DIAGNOSIS — E785 Hyperlipidemia, unspecified: Secondary | ICD-10-CM | POA: Diagnosis not present

## 2020-08-24 DIAGNOSIS — I10 Essential (primary) hypertension: Secondary | ICD-10-CM | POA: Diagnosis not present

## 2020-08-24 MED ORDER — HYDROCHLOROTHIAZIDE 25 MG PO TABS: ORAL_TABLET | ORAL | 1 refills | Status: DC

## 2020-08-24 MED ORDER — VERAPAMIL HCL ER 240 MG PO TBCR: 240.0000 mg | EXTENDED_RELEASE_TABLET | Freq: Every day | ORAL | 1 refills | Status: DC

## 2020-08-24 MED ORDER — ENALAPRIL MALEATE 20 MG PO TABS: ORAL_TABLET | ORAL | 1 refills | Status: DC

## 2020-08-24 MED ORDER — SIMVASTATIN 20 MG PO TABS: 20.0000 mg | ORAL_TABLET | Freq: Every day | ORAL | 1 refills | Status: DC

## 2020-08-24 NOTE — Progress Notes (Signed)
Patient ID: Charles Hurst, male    DOB: 1955-04-18, 65 y.o.   MRN: [id number]   Chief Complaint  Patient presents with   Hypertension   Subjective:    HPI Med check up on bp and hyperlipidema. Pt states no concerns. Takes meds every day, eats healthy, exercises. Would like enalapril changed to 180 tablets instead of 60 so he can have a 90 day supply.  Pt is doing well.   Taking some at bedtime and some in morning.  Went to nephrology and trying to drink a good amt of water and gatorade.   Checking bp- 140s and under.  HTN Pt compliant with BP meds.  No SEs Denies chest pain, sob, LE swelling, or blurry vision.  H/o impaired fasting bg- not having increase in urination or thirst.   HLD- doing well no new concerns.  Pt is compliant with meds. No chest pain, palpitations, myalgias or joint pains.  Medical History Charles Hurst has no past medical history on file.   Outpatient Encounter Medications as of 08/24/2020  Medication Sig   PROCTOSOL HC 2.5 % rectal cream APPLY RECTALLY TWICE A DAY   [DISCONTINUED] enalapril (VASOTEC) 20 MG tablet TAKE ONE TABLET (20MG TOTAL) BY MOUTH TWO TIMES DAILY   [DISCONTINUED] hydrochlorothiazide (HYDRODIURIL) 25 MG tablet TAKE ONE (1) TABLET BY MOUTH EVERY DAY   [DISCONTINUED] simvastatin (ZOCOR) 20 MG tablet Take 1 tablet (20 mg total) by mouth at bedtime.   [DISCONTINUED] verapamil (CALAN-SR) 240 MG CR tablet TAKE ONE TABLET (240MG TOTAL) BY MOUTH AT BEDTIME   enalapril (VASOTEC) 20 MG tablet TAKE ONE TABLET (20MG TOTAL) BY MOUTH TWO TIMES DAILY   hydrochlorothiazide (HYDRODIURIL) 25 MG tablet TAKE ONE (1) TABLET BY MOUTH EVERY DAY   simvastatin (ZOCOR) 20 MG tablet Take 1 tablet (20 mg total) by mouth at bedtime.   verapamil (CALAN-SR) 240 MG CR tablet Take 1 tablet (240 mg total) by mouth at bedtime.   No facility-administered encounter medications on file as of 08/24/2020.     Review of Systems  Constitutional:  Negative for chills and  fever.  HENT:  Negative for congestion, rhinorrhea and sore throat.   Respiratory:  Negative for cough, shortness of breath and wheezing.   Cardiovascular:  Negative for chest pain and leg swelling.  Gastrointestinal:  Negative for abdominal pain, diarrhea, nausea and vomiting.  Genitourinary:  Negative for dysuria and frequency.  Skin:  Negative for rash.  Neurological:  Negative for dizziness, weakness and headaches.    Vitals BP (!) 142/70   Pulse 82   Temp 97.7 F (36.5 C)   Ht 5' 9.5" (1.765 m)   Wt 159 lb (72.1 kg)   SpO2 99%   BMI 23.14 kg/m   Objective:   Physical Exam Vitals and nursing note reviewed.  Constitutional:      General: He is not in acute distress.    Appearance: Normal appearance. He is not ill-appearing.  HENT:     Head: Normocephalic.     Nose: Nose normal. No congestion.     Mouth/Throat:     Mouth: Mucous membranes are moist.     Pharynx: No oropharyngeal exudate.  Eyes:     Extraocular Movements: Extraocular movements intact.     Conjunctiva/sclera: Conjunctivae normal.     Pupils: Pupils are equal, round, and reactive to light.  Cardiovascular:     Rate and Rhythm: Normal rate and regular rhythm.     Pulses: Normal pulses.  Heart sounds: Normal heart sounds. No murmur heard. Pulmonary:     Effort: Pulmonary effort is normal.     Breath sounds: Normal breath sounds. No wheezing, rhonchi or rales.  Musculoskeletal:        General: Normal range of motion.     Right lower leg: No edema.     Left lower leg: No edema.  Skin:    General: Skin is warm and dry.     Findings: No rash.  Neurological:     General: No focal deficit present.     Mental Status: He is alert and oriented to person, place, and time.     Cranial Nerves: No cranial nerve deficit.  Psychiatric:        Mood and Affect: Mood normal.        Behavior: Behavior normal.        Thought Content: Thought content normal.        Judgment: Judgment normal.     Assessment  and Plan   1. Essential hypertension, benign - CBC - CMP14+EGFR - Lipid panel  2. Impaired fasting glucose - CMP14+EGFR  3. Hyperlipidemia, unspecified hyperlipidemia type - Lipid panel  4. Screening PSA (prostate specific antigen) - PSA  5. Stage 3 chronic kidney disease, unspecified whether stage 3a or 3b CKD ([facility])   Htn- suboptimal.  Cont to monitor and dec salt in diet. Pt to check bp at home and if seeing numbers over 140s to call the office.  Cont all other meds.  Pt to get labs today.  Hld- elevated, cont zocor and recheck labs.  Psa- ordered.   Impaired fasting gluc- will recheck and dec carb in diet.  Ckd stage 3- will recheck labs. Last visit Cr at 1.42.   Return in about 6 months (around 02/23/2021) for f/u htn, hld.  09/10/2020

## 2020-09-10 ENCOUNTER — Telehealth: Payer: Self-pay | Admitting: Family Medicine

## 2020-09-11 NOTE — Telephone Encounter (Signed)
Pls see when pt can get labs.  Last labs in 12/21.  Gave 90 day supply of meds in the agreement he would get labs.  Pt needs to update these. Thx. Dr. Ladona Ridgel

## 2020-09-11 NOTE — Telephone Encounter (Signed)
Patient stated he has been busy at work but plans to have his labs drawn Wednesday 09/13/20

## 2020-09-13 DIAGNOSIS — I10 Essential (primary) hypertension: Secondary | ICD-10-CM | POA: Diagnosis not present

## 2020-09-13 DIAGNOSIS — E785 Hyperlipidemia, unspecified: Secondary | ICD-10-CM | POA: Diagnosis not present

## 2020-09-13 DIAGNOSIS — R7301 Impaired fasting glucose: Secondary | ICD-10-CM | POA: Diagnosis not present

## 2020-09-14 LAB — CMP14+EGFR
ALT: 30 IU/L (ref 0–44)
AST: 35 IU/L (ref 0–40)
Albumin/Globulin Ratio: 2 (ref 1.2–2.2)
Albumin: 4.8 g/dL (ref 3.8–4.8)
Alkaline Phosphatase: 138 IU/L — ABNORMAL HIGH (ref 44–121)
BUN/Creatinine Ratio: 14 (ref 10–24)
BUN: 23 mg/dL (ref 8–27)
Bilirubin Total: 0.4 mg/dL (ref 0.0–1.2)
CO2: 25 mmol/L (ref 20–29)
Calcium: 10.1 mg/dL (ref 8.6–10.2)
Chloride: 104 mmol/L (ref 96–106)
Creatinine, Ser: 1.61 mg/dL — ABNORMAL HIGH (ref 0.76–1.27)
Globulin, Total: 2.4 g/dL (ref 1.5–4.5)
Glucose: 103 mg/dL — ABNORMAL HIGH (ref 65–99)
Potassium: 4.2 mmol/L (ref 3.5–5.2)
Sodium: 144 mmol/L (ref 134–144)
Total Protein: 7.2 g/dL (ref 6.0–8.5)
eGFR: 47 mL/min/{1.73_m2} — ABNORMAL LOW (ref >59)

## 2020-09-14 LAB — CBC
Hematocrit: 39.6 % (ref 37.5–51.0)
Hemoglobin: 13.5 g/dL (ref 13.0–17.7)
MCH: 32.7 pg (ref 26.6–33.0)
MCHC: 34.1 g/dL (ref 31.5–35.7)
MCV: 96 fL (ref 79–97)
Platelets: 268 10*3/uL (ref 150–450)
RBC: 4.13 x10E6/uL — ABNORMAL LOW (ref 4.14–5.80)
RDW: 12.8 % (ref 11.6–15.4)
WBC: 5.6 10*3/uL (ref 3.4–10.8)

## 2020-09-14 LAB — LIPID PANEL
Chol/HDL Ratio: 2.4 ratio (ref 0.0–5.0)
Cholesterol, Total: 184 mg/dL (ref 100–199)
HDL: 77 mg/dL (ref >39)
LDL Chol Calc (NIH): 98 mg/dL (ref 0–99)
Triglycerides: 45 mg/dL (ref 0–149)
VLDL Cholesterol Cal: 9 mg/dL (ref 5–40)

## 2020-09-14 LAB — PSA: Prostate Specific Ag, Serum: 1 ng/mL (ref 0.0–4.0)

## 2021-01-16 ENCOUNTER — Other Ambulatory Visit: Payer: Self-pay

## 2021-01-16 ENCOUNTER — Ambulatory Visit (INDEPENDENT_AMBULATORY_CARE_PROVIDER_SITE_OTHER): Payer: Medicare Other

## 2021-01-16 VITALS — Ht 70.0 in | Wt 159.0 lb

## 2021-01-16 DIAGNOSIS — Z0000 Encounter for general adult medical examination without abnormal findings: Secondary | ICD-10-CM | POA: Diagnosis not present

## 2021-01-16 NOTE — Patient Instructions (Signed)
[name] , Thank you for taking time to come for your Medicare Wellness Visit. I appreciate your ongoing commitment to your health goals. Please review the following plan we discussed and let me know if I can assist you in the future.   Screening recommendations/referrals: Colonoscopy: Done 10/27/2015 Repeat in 10 years  Recommended yearly ophthalmology/optometry visit for glaucoma screening and checkup Recommended yearly dental visit for hygiene and checkup  Vaccinations: Influenza vaccine: Declined. Pneumococcal vaccine: Due.  Tdap vaccine: Due Repeat in 10 years  Shingles vaccine: Shingrix discussed. Please contact your pharmacy for coverage information.     Covid-19: Done 05/19/2019  Advanced directives: Advance directive discussed with you today. Even though you declined this today, please call our office should you change your mind, and we can give you the proper paperwork for you to fill out.   Conditions/risks identified: Aim for 30 minutes of exercise or brisk walking each day, drink 6-8 glasses of water and eat lots of fruits and vegetables. KEEP UP THE GOOD WORK!!  Next appointment: Follow up in one year for your annual wellness visit. 2023.  Preventive Care 81 Years and Older, Male  Preventive care refers to lifestyle choices and visits with your health care provider that can promote health and wellness. What does preventive care include? A yearly physical exam. This is also called an annual well check. Dental exams once or twice a year. Routine eye exams. Ask your health care provider how often you should have your eyes checked. Personal lifestyle choices, including: Daily care of your teeth and gums. Regular physical activity. Eating a healthy diet. Avoiding tobacco and drug use. Limiting alcohol use. Practicing safe sex. Taking low doses of aspirin every day. Taking vitamin and mineral supplements as recommended by your health care provider. What happens during  an annual well check? The services and screenings done by your health care provider during your annual well check will depend on your age, overall health, lifestyle risk factors, and family history of disease. Counseling  Your health care provider may ask you questions about your: Alcohol use. Tobacco use. Drug use. Emotional well-being. Home and relationship well-being. Sexual activity. Eating habits. History of falls. Memory and ability to understand (cognition). Work and work Astronomer. Screening  You may have the following tests or measurements: Height, weight, and BMI. Blood pressure. Lipid and cholesterol levels. These may be checked every 5 years, or more frequently if you are over 37 years old. Skin check. Lung cancer screening. You may have this screening every year starting at age 33 if you have a 30-pack-year history of smoking and currently smoke or have quit within the past 15 years. Fecal occult blood test (FOBT) of the stool. You may have this test every year starting at age 72. Flexible sigmoidoscopy or colonoscopy. You may have a sigmoidoscopy every 5 years or a colonoscopy every 10 years starting at age 20. Prostate cancer screening. Recommendations will vary depending on your family history and other risks. Hepatitis C blood test. Hepatitis B blood test. Sexually transmitted disease (STD) testing. Diabetes screening. This is done by checking your blood sugar (glucose) after you have not eaten for a while (fasting). You may have this done every 1-3 years. Abdominal aortic aneurysm (AAA) screening. You may need this if you are a current or former smoker. Osteoporosis. You may be screened starting at age 22 if you are at high risk. Talk with your health care provider about your test results, treatment options, and if necessary,  the need for more tests. Vaccines  Your health care provider may recommend certain vaccines, such as: Influenza vaccine. This is recommended  every year. Tetanus, diphtheria, and acellular pertussis (Tdap, Td) vaccine. You may need a Td booster every 10 years. Zoster vaccine. You may need this after age 4. Pneumococcal 13-valent conjugate (PCV13) vaccine. One dose is recommended after age 41. Pneumococcal polysaccharide (PPSV23) vaccine. One dose is recommended after age 34. Talk to your health care provider about which screenings and vaccines you need and how often you need them. This information is not intended to replace advice given to you by your health care provider. Make sure you discuss any questions you have with your health care provider. Document Released: 03/10/2015 Document Revised: 11/01/2015 Document Reviewed: 12/13/2014 Elsevier Interactive Patient Education  2017 [place] Prevention in the Home Falls can cause injuries. They can happen to people of all ages. There are many things you can do to make your home safe and to help prevent falls. What can I do on the outside of my home? Regularly fix the edges of walkways and driveways and fix any cracks. Remove anything that might make you trip as you walk through a door, such as a raised step or threshold. Trim any bushes or trees on the path to your home. Use bright outdoor lighting. Clear any walking paths of anything that might make someone trip, such as rocks or tools. Regularly check to see if handrails are loose or broken. Make sure that both sides of any steps have handrails. Any raised decks and porches should have guardrails on the edges. Have any leaves, snow, or ice cleared regularly. Use sand or salt on walking paths during winter. Clean up any spills in your garage right away. This includes oil or grease spills. What can I do in the bathroom? Use night lights. Install grab bars by the toilet and in the tub and shower. Do not use towel bars as grab bars. Use non-skid mats or decals in the tub or shower. If you need to sit down in the shower,  use a plastic, non-slip stool. Keep the floor dry. Clean up any water that spills on the floor as soon as it happens. Remove soap buildup in the tub or shower regularly. Attach bath mats securely with double-sided non-slip rug tape. Do not have throw rugs and other things on the floor that can make you trip. What can I do in the bedroom? Use night lights. Make sure that you have a light by your bed that is easy to reach. Do not use any sheets or blankets that are too big for your bed. They should not hang down onto the floor. Have a firm chair that has side arms. You can use this for support while you get dressed. Do not have throw rugs and other things on the floor that can make you trip. What can I do in the kitchen? Clean up any spills right away. Avoid walking on wet floors. Keep items that you use a lot in easy-to-reach places. If you need to reach something above you, use a strong step stool that has a grab bar. Keep electrical cords out of the way. Do not use floor polish or wax that makes floors slippery. If you must use wax, use non-skid floor wax. Do not have throw rugs and other things on the floor that can make you trip. What can I do with my stairs? Do not leave any items on  the stairs. Make sure that there are handrails on both sides of the stairs and use them. Fix handrails that are broken or loose. Make sure that handrails are as long as the stairways. Check any carpeting to make sure that it is firmly attached to the stairs. Fix any carpet that is loose or worn. Avoid having throw rugs at the top or bottom of the stairs. If you do have throw rugs, attach them to the floor with carpet tape. Make sure that you have a light switch at the top of the stairs and the bottom of the stairs. If you do not have them, ask someone to add them for you. What else can I do to help prevent falls? Wear shoes that: Do not have high heels. Have rubber bottoms. Are comfortable and fit you  well. Are closed at the toe. Do not wear sandals. If you use a stepladder: Make sure that it is fully opened. Do not climb a closed stepladder. Make sure that both sides of the stepladder are locked into place. Ask someone to hold it for you, if possible. Clearly mark and make sure that you can see: Any grab bars or handrails. First and last steps. Where the edge of each step is. Use tools that help you move around (mobility aids) if they are needed. These include: Canes. Walkers. Scooters. Crutches. Turn on the lights when you go into a dark area. Replace any light bulbs as soon as they burn out. Set up your furniture so you have a clear path. Avoid moving your furniture around. If any of your floors are uneven, fix them. If there are any pets around you, be aware of where they are. Review your medicines with your doctor. Some medicines can make you feel dizzy. This can increase your chance of falling. Ask your doctor what other things that you can do to help prevent falls. This information is not intended to replace advice given to you by your health care provider. Make sure you discuss any questions you have with your health care provider. Document Released: 12/08/2008 Document Revised: 07/20/2015 Document Reviewed: 03/18/2014 Elsevier Interactive Patient Education  2017 [employer].

## 2021-01-16 NOTE — Progress Notes (Signed)
Subjective:   Charles Hurst is a 65 y.o. male who presents for an Initial Medicare Annual Wellness Visit. Virtual Visit via Telephone Note  I connected with  Charles Hurst on 01/16/21 at  3:40 PM EST by telephone and verified that I am speaking with the correct person using two identifiers.  Location: Patient: Home Provider: [facility] Persons participating in the virtual visit: patient/Nurse Health Advisor   I discussed the limitations, risks, security and privacy concerns of performing an evaluation and management service by telephone and the availability of in person appointments. The patient expressed understanding and agreed to proceed.  Interactive audio and video telecommunications were attempted between this nurse and patient, however failed, due to patient having technical difficulties OR patient did not have access to video capability.  We continued and completed visit with audio only.  Some vital signs may be absent or patient reported.   Darral Dash, LPN  Review of Systems     Cardiac Risk Factors include: advanced age (>31men, >55 women);hypertension;male gender;dyslipidemia;sedentary lifestyle     Objective:    Today's Vitals   01/16/21 1555  Weight: 159 lb (72.1 kg)  Height: 5\' 10"  (1.778 m)   Body mass index is 22.81 kg/m.  Advanced Directives 01/16/2021  Does Patient Have a Medical Advance Directive? No  Would patient like information on creating a medical advance directive? No - Patient declined    Current Medications (verified) Outpatient Encounter Medications as of 01/16/2021  Medication Sig   enalapril (VASOTEC) 20 MG tablet TAKE ONE TABLET (20MG  TOTAL) BY MOUTH TWO TIMES DAILY   hydrochlorothiazide (HYDRODIURIL) 25 MG tablet TAKE ONE (1) TABLET BY MOUTH EVERY DAY   PROCTOSOL HC 2.5 % rectal cream APPLY RECTALLY TWICE A DAY   simvastatin (ZOCOR) 20 MG tablet Take 1 tablet (20 mg total) by mouth at bedtime.   verapamil (CALAN-SR) 240 MG CR  tablet Take 1 tablet (240 mg total) by mouth at bedtime.   No facility-administered encounter medications on file as of 01/16/2021.    Allergies (verified) Patient has no known allergies.   History: No past medical history on file. No past surgical history on file. No family history on file. Social History   Socioeconomic History   Marital status: Married    Spouse name:   Number of children: 2   Years of education: Not on file   Highest education level: Not on file  Occupational History   Not on file  Tobacco Use   Smoking status: Never   Smokeless tobacco: Never  Substance and Sexual Activity   Alcohol use: Not on file   Drug use: Not on file   Sexual activity: Not on file  Other Topics Concern   Not on file  Social History Narrative   2 sons   1 granddaugther   Social Determinants of Health   Financial Resource Strain: Low Risk    Difficulty of Paying Living Expenses: Not hard at all  Food Insecurity: No Food Insecurity   Worried About 01/18/2021 in the Last Year: Never true   Renea Ee in the Last Year: Never true  Transportation Needs: No Transportation Needs   Lack of Transportation (Medical): No   Lack of Transportation (Non-Medical): No  Physical Activity: Insufficiently Active   Days of Exercise per Week: 4 days   Minutes of Exercise per Session: 30 min  Stress: No Stress Concern Present   Feeling of Stress : Not  at all  Social Connections: Socially Integrated   Frequency of Communication with Friends and Family: More than three times a week   Frequency of Social Gatherings with Friends and Family: Once a week   Attends Religious Services: More than 4 times per year   Active Member of [employer] or Organizations: Yes   Attends Engineer, structural: More than 4 times per year   Marital Status: Married    Tobacco Counseling Counseling given: Not Answered   Clinical Intake:  Pre-visit preparation completed: Yes  Pain :  No/denies pain     BMI - recorded: 22.81 Nutritional Status: BMI of 19-24  Normal Nutritional Risks: None Diabetes: No  How often do you need to have someone help you when you read instructions, pamphlets, or other written materials from your doctor or pharmacy?: 1 - Never  Diabetic?No  Interpreter Needed?: No  Information entered by :: MJ Morse Brueggemann, LPN   Activities of Daily Living In your present state of health, do you have any difficulty performing the following activities: 01/16/2021 01/15/2021  Hearing? N N  Vision? N N  Difficulty concentrating or making decisions? N N  Walking or climbing stairs? N N  Dressing or bathing? N N  Doing errands, shopping? N N  Preparing Food and eating ? N N  Using the Toilet? N N  In the past six months, have you accidently leaked urine? N N  Do you have problems with loss of bowel control? N N  Managing your Medications? N N  Managing your Finances? N N  Housekeeping or managing your Housekeeping? N N  Some recent data might be hidden    Patient Care Team: Annalee Genta, DO as PCP - General (Family Medicine)  Indicate any recent Medical Services you may have received from other than [facility] providers in the past year (date may be approximate).     Assessment:   This is a routine wellness examination for Charles Hurst.  Hearing/Vision screen Hearing Screening - Comments:: No hearing issues.  Vision Screening - Comments:: [facility] 01/2021  Dietary issues and exercise activities discussed: Current Exercise Habits: Home exercise routine, Type of exercise: walking, Time (Minutes): 30, Frequency (Times/Week): 4, Weekly Exercise (Minutes/Week): 120, Intensity: Mild, Exercise limited by: cardiac condition(s)   Goals Addressed             This Visit's Progress    Exercise 3x per week (30 min per time)       Continue to exercise, monitor BP and stay healthy/       Depression Screen PHQ 2/9 Scores 01/16/2021 08/24/2020  11/24/2019 10/27/2018 07/31/2017  PHQ - 2 Score 0 0 0 0 0    Fall Risk Fall Risk  01/16/2021 01/15/2021 08/24/2020 11/24/2019 07/31/2017  Falls in the past year? 0 0 0 0 No  Number falls in past yr: 0 0 - 0 -  Injury with Fall? 0 0 - 0 -  Risk for fall due to : No Fall Risks - - - -  Follow up Falls prevention discussed - Falls evaluation completed Falls evaluation completed -    FALL RISK PREVENTION PERTAINING TO THE HOME:  Any stairs in or around the home? Yes  If so, are there any without handrails? No  Home free of loose throw rugs in walkways, pet beds, electrical cords, etc? Yes  Adequate lighting in your home to reduce risk of falls? Yes   ASSISTIVE DEVICES UTILIZED TO PREVENT FALLS:  Life alert?  No  Use of a cane, walker or w/c? No  Grab bars in the bathroom? No  Shower chair or bench in shower? No  Elevated toilet seat or a handicapped toilet? Yes   TIMED UP AND GO:  Was the test performed? No .  Phone visit.     Cognitive Function:     6CIT Screen 01/16/2021  What Year? 0 points  What month? 0 points  What time? 0 points  Count back from 20 0 points  Months in reverse 0 points  Repeat phrase 0 points  Total Score 0    Immunizations Immunization History  Administered Date(s) Administered   Janssen (J&J) SARS-COV-2 Vaccination 05/19/2019    TDAP status: Due, Education has been provided regarding the importance of this vaccine. Advised may receive this vaccine at local pharmacy or Health Dept. Aware to provide a copy of the vaccination record if obtained from local pharmacy or Health Dept. Verbalized acceptance and understanding.  Flu Vaccine status: Due, Education has been provided regarding the importance of this vaccine. Advised may receive this vaccine at local pharmacy or Health Dept. Aware to provide a copy of the vaccination record if obtained from local pharmacy or Health Dept. Verbalized acceptance and understanding.  Pneumococcal vaccine status: Due,  Education has been provided regarding the importance of this vaccine. Advised may receive this vaccine at local pharmacy or Health Dept. Aware to provide a copy of the vaccination record if obtained from local pharmacy or Health Dept. Verbalized acceptance and understanding.  Covid-19 vaccine status: Information provided on how to obtain vaccines.   Qualifies for Shingles Vaccine? Yes   Zostavax completed No   Shingrix Completed?: No.    Education has been provided regarding the importance of this vaccine. Patient has been advised to call insurance company to determine out of pocket expense if they have not yet received this vaccine. Advised may also receive vaccine at local pharmacy or Health Dept. Verbalized acceptance and understanding.  Screening Tests Health Maintenance  Topic Date Due   Pneumonia Vaccine 39+ Years old (1 - PCV) Never done   Hepatitis C Screening  Never done   TETANUS/TDAP  Never done   Zoster Vaccines- Shingrix (1 of 2) Never done   INFLUENZA VACCINE  Never done   COLONOSCOPY (Pts 45-53yrs Insurance coverage will need to be confirmed)  10/26/2025   HPV VACCINES  Aged Out   COVID-19 Vaccine  Discontinued    Health Maintenance  Health Maintenance Due  Topic Date Due   Pneumonia Vaccine 62+ Years old (1 - PCV) Never done   Hepatitis C Screening  Never done   TETANUS/TDAP  Never done   Zoster Vaccines- Shingrix (1 of 2) Never done   INFLUENZA VACCINE  Never done    Colorectal cancer screening: Type of screening: Colonoscopy. Completed 10/27/2015. Repeat every 10 years  Lung Cancer Screening: (Low Dose CT Chest recommended if Age 83-80 years, 30 pack-year currently smoking OR have quit w/in 15years.) does not qualify.  Non smoker.  Additional Screening:  Hepatitis C Screening: does qualify; Completed Not done yet.  Vision Screening: Recommended annual ophthalmology exams for early detection of glaucoma and other disorders of the eye. Is the patient up to date  with their annual eye exam?  No  Who is the provider or what is the name of the office in which the patient attends annual eye exams? [facility]  If pt is not established with a provider, would they like to  be referred to a provider to establish care? No .   Dental Screening: Recommended annual dental exams for proper oral hygiene  Community Resource Referral / Chronic Care Management: CRR required this visit?  No   CCM required this visit?  No      Plan:     I have personally reviewed and noted the following in the patient's chart:   Medical and social history Use of alcohol, tobacco or illicit drugs  Current medications and supplements including opioid prescriptions. Patient is not currently taking opioid prescriptions. Functional ability and status Nutritional status Physical activity Advanced directives List of other physicians Hospitalizations, surgeries, and ER visits in previous 12 months Vitals Screenings to include cognitive, depression, and falls Referrals and appointments  In addition, I have reviewed and discussed with patient certain preventive protocols, quality metrics, and best practice recommendations. A written personalized care plan for preventive services as well as general preventive health recommendations were provided to patient.     Darral Dash, LPN   [id number]   Nurse Notes: Phone vist. Up to date on colonoscopy. Pt states he will call to schedule eye exam for next month. Discussed Flu, Covid, Shingles and Pneumonia vaccines and how to obtain them.

## 2021-02-16 ENCOUNTER — Other Ambulatory Visit: Payer: Self-pay | Admitting: Family Medicine

## 2021-03-09 ENCOUNTER — Other Ambulatory Visit: Payer: Self-pay | Admitting: Family Medicine

## 2021-06-05 ENCOUNTER — Ambulatory Visit (INDEPENDENT_AMBULATORY_CARE_PROVIDER_SITE_OTHER): Payer: Medicare Other | Admitting: Family Medicine

## 2021-06-05 VITALS — BP 150/67 | HR 72 | Temp 98.0°F | Ht 70.0 in | Wt 163.0 lb

## 2021-06-05 DIAGNOSIS — I10 Essential (primary) hypertension: Secondary | ICD-10-CM | POA: Diagnosis not present

## 2021-06-05 DIAGNOSIS — E785 Hyperlipidemia, unspecified: Secondary | ICD-10-CM

## 2021-06-05 DIAGNOSIS — Z125 Encounter for screening for malignant neoplasm of prostate: Secondary | ICD-10-CM

## 2021-06-05 DIAGNOSIS — N1831 Chronic kidney disease, stage 3a: Secondary | ICD-10-CM | POA: Diagnosis not present

## 2021-06-05 DIAGNOSIS — R7309 Other abnormal glucose: Secondary | ICD-10-CM | POA: Diagnosis not present

## 2021-06-05 MED ORDER — ENALAPRIL MALEATE 20 MG PO TABS: 20.0000 mg | ORAL_TABLET | Freq: Two times a day (BID) | ORAL | 3 refills | Status: DC

## 2021-06-05 NOTE — Progress Notes (Signed)
? ?Subjective:  ?Patient ID: Charles Hurst, male    DOB: [birth year]  Age: 66 y.o. MRN: [id number] ? ?CC: ?Chief Complaint  ?Patient presents with  ? Establish Care  ? Medication Refill  ?  Enalapril  ? ? ?HPI: ? ?66 year old male with hypertension, hyperlipidemia, CKD stage III presents for follow-up. ? ?Patient's blood pressure is elevated today.  Patient states that his blood pressure normally runs in the 151V systolic at home.  He endorses compliance with hydrochlorothiazide, verapamil, and enalapril.  Needs refill on enalapril. ? ?Patient's hyperlipidemia has been well controlled based on most recent laboratory studies.  Last LDL was 98.  He is on simvastatin.  Tolerating without difficulty. ? ?It has not been documented in the chart, however patient appears to have chronic kidney disease.  He has elevated creatinine which dates back many years.  Most recent creatinine 1.6 with a GFR 47.  He is not currently seeing nephrology. ? ?Patient Active Problem List  ? Diagnosis Date Noted  ? Stage 3a chronic kidney disease ([place]) 06/05/2021  ? Allergic rhinitis 07/14/2013  ? Irritable bowel syndrome 03/01/2013  ? Essential hypertension, benign 12/13/2012  ? Hyperlipidemia 12/13/2012  ? ?Social Hx   ?Social History  ? ?Socioeconomic History  ? Marital status: Married  ?  Spouse name: Estill Bamberg  ? Number of children: 2  ? Years of education: Not on file  ? Highest education level: Not on file  ?Occupational History  ? Not on file  ?Tobacco Use  ? Smoking status: Never  ? Smokeless tobacco: Never  ?Substance and Sexual Activity  ? Alcohol use: Not on file  ? Drug use: Not on file  ? Sexual activity: Not on file  ?Other Topics Concern  ? Not on file  ?Social History Narrative  ? 2 sons  ? 1 granddaugther  ? ?Social Determinants of Health  ? ?Financial Resource Strain: Low Risk   ? Difficulty of Paying Living Expenses: Not hard at all  ?Food Insecurity: No Food Insecurity  ? Worried About Charity fundraiser in the Last Year:  Never true  ? Ran Out of Food in the Last Year: Never true  ?Transportation Needs: No Transportation Needs  ? Lack of Transportation (Medical): No  ? Lack of Transportation (Non-Medical): No  ?Physical Activity: Insufficiently Active  ? Days of Exercise per Week: 4 days  ? Minutes of Exercise per Session: 30 min  ?Stress: No Stress Concern Present  ? Feeling of Stress : Not at all  ?Social Connections: Socially Integrated  ? Frequency of Communication with Friends and Family: More than three times a week  ? Frequency of Social Gatherings with Friends and Family: Once a week  ? Attends Religious Services: More than 4 times per year  ? Active Member of Clubs or Organizations: Yes  ? Attends Archivist Meetings: More than 4 times per year  ? Marital Status: Married  ? ? ?Review of Systems  ?Constitutional: Negative.   ?Respiratory: Negative.    ?Cardiovascular: Negative.   ? ?Objective:  ?BP (!) 150/67   Pulse 72   Temp 98 ?F (36.7 ?C) (Oral)   Ht $R'5\' 10"'OH$  (1.778 m)   Wt 163 lb (73.9 kg)   SpO2 98%   BMI 23.39 kg/m?  ? ? ?  06/05/2021  ?  9:05 AM 06/05/2021  ?  8:18 AM 01/16/2021  ?  3:55 PM  ?BP/Weight  ?Systolic BP 616 073   ?Diastolic BP 67 60   ?  Wt. (Lbs)  163 159  ?BMI  23.39 kg/m2 22.81 kg/m2  ? ? ?Physical Exam ?Vitals and nursing note reviewed.  ?Constitutional:   ?   General: He is not in acute distress. ?   Appearance: Normal appearance. He is not ill-appearing.  ?HENT:  ?   Head: Normocephalic and atraumatic.  ?Eyes:  ?   General:     ?   Right eye: No discharge.     ?   Left eye: No discharge.  ?   Conjunctiva/sclera: Conjunctivae normal.  ?Cardiovascular:  ?   Rate and Rhythm: Normal rate and regular rhythm.  ?   Heart sounds: Murmur heard.  ?Pulmonary:  ?   Effort: Pulmonary effort is normal.  ?   Breath sounds: Normal breath sounds. No wheezing, rhonchi or rales.  ?Neurological:  ?   Mental Status: He is alert.  ?Psychiatric:     ?   Mood and Affect: Mood normal.     ?   Behavior: Behavior  normal.  ? ? ?Lab Results  ?Component Value Date  ? WBC 5.6 09/13/2020  ? HGB 13.5 09/13/2020  ? HCT 39.6 09/13/2020  ? PLT 268 09/13/2020  ? GLUCOSE 103 (H) 09/13/2020  ? CHOL 184 09/13/2020  ? TRIG 45 09/13/2020  ? HDL 77 09/13/2020  ? Nora 98 09/13/2020  ? ALT 30 09/13/2020  ? AST 35 09/13/2020  ? NA 144 09/13/2020  ? K 4.2 09/13/2020  ? CL 104 09/13/2020  ? CREATININE 1.61 (H) 09/13/2020  ? BUN 23 09/13/2020  ? CO2 25 09/13/2020  ? PSA 0.81 09/09/2013  ? HGBA1C 5.5 06/27/2015  ? ? ? ?Assessment & Plan:  ? ?Problem List Items Addressed This Visit   ? ?  ? Cardiovascular and Mediastinum  ? Essential hypertension, benign - Primary  ?  BP elevated today.  Patient states that his blood pressures are well controlled at home.  Continue verapamil, hydrochlorothiazide, and enalapril.  Enalapril refilled today. ?Labs ordered. ?  ?  ? Relevant Medications  ? enalapril (VASOTEC) 20 MG tablet  ? Other Relevant Orders  ? CMP14+EGFR  ?  ? Genitourinary  ? Stage 3a chronic kidney disease ([place])  ?  Not currently see nephrology.  Labs ordered.  Has been stable. ?  ?  ? Relevant Orders  ? CBC  ? Microalbumin / creatinine urine ratio  ?  ? Other  ? Hyperlipidemia  ?  Well-controlled.  Continue simvastatin.  Labs ordered. ?  ?  ? Relevant Medications  ? enalapril (VASOTEC) 20 MG tablet  ? Other Relevant Orders  ? Lipid panel  ? ?Other Visit Diagnoses   ? ? Elevated glucose      ? Relevant Orders  ? Hemoglobin A1c  ? Prostate cancer screening      ? Relevant Orders  ? PSA  ? ?  ? ? ?Meds ordered this encounter  ?Medications  ? enalapril (VASOTEC) 20 MG tablet  ?  Sig: Take 1 tablet (20 mg total) by mouth 2 (two) times daily.  ?  Dispense:  180 tablet  ?  Refill:  3  ? ?Thersa Salt DO ?[place] ? ?

## 2021-06-05 NOTE — Assessment & Plan Note (Signed)
Well-controlled.  Continue simvastatin.  Labs ordered. ?

## 2021-06-05 NOTE — Patient Instructions (Signed)
Labs today. ? ?Follow up in 3-6 months (pending labs). ? ?Take care ? ?Dr. Adriana Simas  ?

## 2021-06-05 NOTE — Assessment & Plan Note (Signed)
BP elevated today.  Patient states that his blood pressures are well controlled at home.  Continue verapamil, hydrochlorothiazide, and enalapril.  Enalapril refilled today. ?Labs ordered. ?

## 2021-06-05 NOTE — Assessment & Plan Note (Signed)
Not currently see nephrology.  Labs ordered.  Has been stable. ?

## 2021-08-21 ENCOUNTER — Other Ambulatory Visit: Payer: Self-pay | Admitting: Family Medicine

## 2021-12-03 DIAGNOSIS — N1831 Chronic kidney disease, stage 3a: Secondary | ICD-10-CM | POA: Diagnosis not present

## 2021-12-03 DIAGNOSIS — R7309 Other abnormal glucose: Secondary | ICD-10-CM | POA: Diagnosis not present

## 2021-12-03 DIAGNOSIS — E785 Hyperlipidemia, unspecified: Secondary | ICD-10-CM | POA: Diagnosis not present

## 2021-12-03 DIAGNOSIS — I10 Essential (primary) hypertension: Secondary | ICD-10-CM | POA: Diagnosis not present

## 2021-12-04 LAB — CMP14+EGFR
ALT: 28 IU/L (ref 0–44)
AST: 33 IU/L (ref 0–40)
Albumin/Globulin Ratio: 2 (ref 1.2–2.2)
Albumin: 4.9 g/dL (ref 3.9–4.9)
Alkaline Phosphatase: 150 IU/L — ABNORMAL HIGH (ref 44–121)
BUN/Creatinine Ratio: 13 (ref 10–24)
BUN: 23 mg/dL (ref 8–27)
Bilirubin Total: 0.4 mg/dL (ref 0.0–1.2)
CO2: 25 mmol/L (ref 20–29)
Calcium: 9.9 mg/dL (ref 8.6–10.2)
Chloride: 101 mmol/L (ref 96–106)
Creatinine, Ser: 1.75 mg/dL — ABNORMAL HIGH (ref 0.76–1.27)
Globulin, Total: 2.5 g/dL (ref 1.5–4.5)
Glucose: 99 mg/dL (ref 70–99)
Potassium: 3.7 mmol/L (ref 3.5–5.2)
Sodium: 141 mmol/L (ref 134–144)
Total Protein: 7.4 g/dL (ref 6.0–8.5)
eGFR: 42 mL/min/{1.73_m2} — ABNORMAL LOW (ref >59)

## 2021-12-04 LAB — CBC
Hematocrit: 40.7 % (ref 37.5–51.0)
Hemoglobin: 14.4 g/dL (ref 13.0–17.7)
MCH: 34.3 pg — ABNORMAL HIGH (ref 26.6–33.0)
MCHC: 35.4 g/dL (ref 31.5–35.7)
MCV: 97 fL (ref 79–97)
Platelets: 259 10*3/uL (ref 150–450)
RBC: 4.2 x10E6/uL (ref 4.14–5.80)
RDW: 13 % (ref 11.6–15.4)
WBC: 5.1 10*3/uL (ref 3.4–10.8)

## 2021-12-04 LAB — HEMOGLOBIN A1C
Est. average glucose Bld gHb Est-mCnc: 108 mg/dL
Hgb A1c MFr Bld: 5.4 % (ref 4.8–5.6)

## 2021-12-04 LAB — MICROALBUMIN / CREATININE URINE RATIO
Creatinine, Urine: 211.9 mg/dL
Microalb/Creat Ratio: 5 mg/g creat (ref 0–29)
Microalbumin, Urine: 10.4 ug/mL

## 2021-12-04 LAB — LIPID PANEL
Chol/HDL Ratio: 2.4 ratio (ref 0.0–5.0)
Cholesterol, Total: 180 mg/dL (ref 100–199)
HDL: 75 mg/dL (ref >39)
LDL Chol Calc (NIH): 92 mg/dL (ref 0–99)
Triglycerides: 67 mg/dL (ref 0–149)
VLDL Cholesterol Cal: 13 mg/dL (ref 5–40)

## 2021-12-04 LAB — PSA: Prostate Specific Ag, Serum: 1 ng/mL (ref 0.0–4.0)

## 2021-12-05 ENCOUNTER — Ambulatory Visit (INDEPENDENT_AMBULATORY_CARE_PROVIDER_SITE_OTHER): Payer: Medicare Other | Admitting: Family Medicine

## 2021-12-05 DIAGNOSIS — N1831 Chronic kidney disease, stage 3a: Secondary | ICD-10-CM

## 2021-12-05 DIAGNOSIS — I10 Essential (primary) hypertension: Secondary | ICD-10-CM | POA: Diagnosis not present

## 2021-12-05 DIAGNOSIS — R748 Abnormal levels of other serum enzymes: Secondary | ICD-10-CM

## 2021-12-05 DIAGNOSIS — E785 Hyperlipidemia, unspecified: Secondary | ICD-10-CM | POA: Diagnosis not present

## 2021-12-05 MED ORDER — ENALAPRIL MALEATE 20 MG PO TABS: 20.0000 mg | ORAL_TABLET | Freq: Two times a day (BID) | ORAL | 3 refills | Status: DC

## 2021-12-05 MED ORDER — HYDROCORTISONE (PERIANAL) 2.5 % EX CREA: TOPICAL_CREAM | CUTANEOUS | 6 refills | Status: DC

## 2021-12-05 MED ORDER — SIMVASTATIN 20 MG PO TABS: ORAL_TABLET | ORAL | 1 refills | Status: DC

## 2021-12-05 MED ORDER — VERAPAMIL HCL ER 240 MG PO TBCR: EXTENDED_RELEASE_TABLET | ORAL | 1 refills | Status: DC

## 2021-12-05 MED ORDER — HYDROCHLOROTHIAZIDE 25 MG PO TABS: 25.0000 mg | ORAL_TABLET | Freq: Every day | ORAL | 3 refills | Status: DC

## 2021-12-05 NOTE — Patient Instructions (Signed)
Labs prior to visit - I want to see you in 3 months.  Continue your meds.  Be sure to get Flu vaccine and Pneumonia vaccine.  Take care  Dr. Lacinda Axon

## 2021-12-05 NOTE — Assessment & Plan Note (Signed)
Stable on Simvastatin. Continue. Refilled today.

## 2021-12-05 NOTE — Progress Notes (Signed)
Subjective:  Patient ID: Charles Hurst, male    DOB: [birth year]  Age: 66 y.o. MRN: [id number]  CC: Chief Complaint  Patient presents with   Hypertension    120/70 at home Refill meds today Joint pain     HPI:  66 year old male with HTN, HLD, CKD 3 presents for follow up.  Patient states he is doing well. Has aches and pains but no concerns today.  Wants to wait on flu vaccine and pneumonia vaccine. Planning on getting in the future.  HTN stable on HCTZ, Enalapril and Verapamil.  Lipids stable and well controlled on Simvastatin.  Recent mild increase in creatinine, 1.61 >> 1.75.  Elevated alk phos noted as well. Needs repeat labs.    Patient Active Problem List   Diagnosis Date Noted   Stage 3a chronic kidney disease ([place]) 06/05/2021   Essential hypertension, benign 12/13/2012   Hyperlipidemia 12/13/2012    Social Hx   Social History   Socioeconomic History   Marital status: Married    Spouse name: Estill Bamberg   Number of children: 2   Years of education: Not on file   Highest education level: Not on file  Occupational History   Not on file  Tobacco Use   Smoking status: Never   Smokeless tobacco: Never  Substance and Sexual Activity   Alcohol use: Not on file   Drug use: Not on file   Sexual activity: Not on file  Other Topics Concern   Not on file  Social History Narrative   2 sons   1 granddaugther   Social Determinants of Health   Financial Resource Strain: Low Risk  (01/16/2021)   Overall Financial Resource Strain (CARDIA)    Difficulty of Paying Living Expenses: Not hard at all  Food Insecurity: No Food Insecurity (01/16/2021)   Hunger Vital Sign    Worried About Running Out of Food in the Last Year: Never true    Doffing in the Last Year: Never true  Transportation Needs: No Transportation Needs (01/16/2021)   PRAPARE - Hydrologist (Medical): No    Lack of Transportation (Non-Medical): No  Physical  Activity: Insufficiently Active (01/16/2021)   Exercise Vital Sign    Days of Exercise per Week: 4 days    Minutes of Exercise per Session: 30 min  Stress: No Stress Concern Present (01/16/2021)   [place]    Feeling of Stress : Not at all  Social Connections: [place] (01/16/2021)   Social Connection and Isolation Panel [NHANES]    Frequency of Communication with Friends and Family: More than three times a week    Frequency of Social Gatherings with Friends and Family: Once a week    Attends Religious Services: More than 4 times per year    Active Member of [employer] or Organizations: Yes    Attends Music therapist: More than 4 times per year    Marital Status: Married    Review of Systems  Constitutional: Negative.   Respiratory: Negative.    Cardiovascular: Negative.    Objective:  BP 138/74   Pulse 88   Temp 97.7 F (36.5 C)   Ht 5' 10" (1.778 m)   Wt 160 lb (72.6 kg)   SpO2 99%   BMI 22.96 kg/m      12/05/2021    8:51 AM 12/05/2021    8:32 AM 06/05/2021  9:05 AM  BP/Weight  Systolic BP 960 454 098  Diastolic BP 74 82 67  Wt. (Lbs)  160   BMI  22.96 kg/m2     Physical Exam Vitals and nursing note reviewed.  Constitutional:      General: He is not in acute distress.    Appearance: Normal appearance.  HENT:     Head: Normocephalic and atraumatic.  Eyes:     General:        Right eye: No discharge.        Left eye: No discharge.     Conjunctiva/sclera: Conjunctivae normal.  Cardiovascular:     Rate and Rhythm: Normal rate and regular rhythm.  Pulmonary:     Effort: Pulmonary effort is normal.     Breath sounds: Normal breath sounds. No wheezing, rhonchi or rales.  Neurological:     Mental Status: He is alert.  Psychiatric:        Mood and Affect: Mood normal.        Behavior: Behavior normal.     Lab Results  Component Value Date   WBC 5.1 12/03/2021    HGB 14.4 12/03/2021   HCT 40.7 12/03/2021   PLT 259 12/03/2021   GLUCOSE 99 12/03/2021   CHOL 180 12/03/2021   TRIG 67 12/03/2021   HDL 75 12/03/2021   LDLCALC 92 12/03/2021   ALT 28 12/03/2021   AST 33 12/03/2021   NA 141 12/03/2021   K 3.7 12/03/2021   CL 101 12/03/2021   CREATININE 1.75 (H) 12/03/2021   BUN 23 12/03/2021   CO2 25 12/03/2021   PSA 0.81 09/09/2013   HGBA1C 5.4 12/03/2021     Assessment & Plan:   Problem List Items Addressed This Visit       Cardiovascular and Mediastinum   Essential hypertension, benign    Stable. Continue current meds. Refilled today.      Relevant Medications   enalapril (VASOTEC) 20 MG tablet   hydrochlorothiazide (HYDRODIURIL) 25 MG tablet   simvastatin (ZOCOR) 20 MG tablet   verapamil (CALAN-SR) 240 MG CR tablet     Genitourinary   Stage 3a chronic kidney disease ([facility])    Recent mild increase in creatinine. Labs in 3 months. Follow up in 3 months.       Relevant Orders   Basic Metabolic Panel     Other   Hyperlipidemia    Stable on Simvastatin. Continue. Refilled today.       Relevant Medications   enalapril (VASOTEC) 20 MG tablet   hydrochlorothiazide (HYDRODIURIL) 25 MG tablet   simvastatin (ZOCOR) 20 MG tablet   verapamil (CALAN-SR) 240 MG CR tablet   Other Visit Diagnoses     Elevated alkaline phosphatase level       Relevant Orders   Gamma GT       Meds ordered this encounter  Medications   enalapril (VASOTEC) 20 MG tablet    Sig: Take 1 tablet (20 mg total) by mouth 2 (two) times daily.    Dispense:  180 tablet    Refill:  3   hydrochlorothiazide (HYDRODIURIL) 25 MG tablet    Sig: Take 1 tablet (25 mg total) by mouth daily.    Dispense:  90 tablet    Refill:  3   hydrocortisone (PROCTOSOL HC) 2.5 % rectal cream    Sig: APPLY RECTALLY TWICE A DAY    Dispense:  28.35 g    Refill:  6   simvastatin (ZOCOR) 20 MG tablet  Sig: TAKE ONE TABLET (20MG TOTAL) BY MOUTH ATBEDTIME    Dispense:  90  tablet    Refill:  1   verapamil (CALAN-SR) 240 MG CR tablet    Sig: TAKE ONE TABLET (240MG TOTAL) BY MOUTH AT BEDTIME    Dispense:  90 tablet    Refill:  1    Follow-up:  Return in about 3 months (around 03/07/2022).  [place]

## 2021-12-05 NOTE — Assessment & Plan Note (Signed)
Recent mild increase in creatinine. Labs in 3 months. Follow up in 3 months.

## 2021-12-05 NOTE — Assessment & Plan Note (Signed)
Stable. Continue current meds. Refilled today. 

## 2021-12-31 ENCOUNTER — Telehealth: Payer: Self-pay | Admitting: *Deleted

## 2021-12-31 NOTE — Patient Outreach (Signed)
  Care Coordination   12/31/2021 Name: Charles Hurst MRN: [id number] DOB: 06/04/1955   Care Coordination Outreach Attempts:  An unsuccessful telephone outreach was attempted today to offer the patient information about available care coordination services as a benefit of their health plan.   Follow Up Plan:  Additional outreach attempts will be made to offer the patient care coordination information and services.   Encounter Outcome:  No Answer  Care Coordination Interventions Activated:  No   Care Coordination Interventions:  No, not indicated    SIG Deonna Krummel C. Myrtie Neither, MSN, [facility] Gerontological Nurse Practitioner [facility] Care Management [phone]

## 2022-02-01 ENCOUNTER — Ambulatory Visit (INDEPENDENT_AMBULATORY_CARE_PROVIDER_SITE_OTHER): Payer: Medicare Other

## 2022-02-01 VITALS — Ht 70.0 in | Wt 160.0 lb

## 2022-02-01 DIAGNOSIS — Z0000 Encounter for general adult medical examination without abnormal findings: Secondary | ICD-10-CM

## 2022-02-01 NOTE — Patient Instructions (Signed)
[name] , Thank you for taking time to come for your Medicare Wellness Visit. I appreciate your ongoing commitment to your health goals. Please review the following plan we discussed and let me know if I can assist you in the future.   These are the goals we discussed:  Goals      Exercise 3x per week (30 min per time)     Continue to exercise, monitor BP and stay healthy/        This is a list of the screening recommended for you and due dates:  Health Maintenance  Topic Date Due   Hepatitis C Screening: USPSTF Recommendation to screen - Ages 15-79 yo.  Never done   DTaP/Tdap/Td vaccine (1 - Tdap) Never done   Zoster (Shingles) Vaccine (1 of 2) Never done   Pneumonia Vaccine (1 - PCV) Never done   Flu Shot  Never done   Medicare Annual Wellness Visit  02/02/2023   Colon Cancer Screening  10/26/2025   HPV Vaccine  Aged Out   COVID-19 Vaccine  Discontinued    Advanced directives: Advance directive discussed with you today. I have provided a copy for you to complete at home and have notarized. Once this is complete please bring a copy in to our office so we can scan it into your chart.   Conditions/risks identified: Aim for 30 minutes of exercise or brisk walking, 6-8 glasses of water, and 5 servings of fruits and vegetables each day.   Next appointment: Follow up in one year for your annual wellness visit.   Preventive Care 57 Years and Older, Male  Preventive care refers to lifestyle choices and visits with your health care provider that can promote health and wellness. What does preventive care include? A yearly physical exam. This is also called an annual well check. Dental exams once or twice a year. Routine eye exams. Ask your health care provider how often you should have your eyes checked. Personal lifestyle choices, including: Daily care of your teeth and gums. Regular physical activity. Eating a healthy diet. Avoiding tobacco and drug use. Limiting alcohol  use. Practicing safe sex. Taking low doses of aspirin every day. Taking vitamin and mineral supplements as recommended by your health care provider. What happens during an annual well check? The services and screenings done by your health care provider during your annual well check will depend on your age, overall health, lifestyle risk factors, and family history of disease. Counseling  Your health care provider may ask you questions about your: Alcohol use. Tobacco use. Drug use. Emotional well-being. Home and relationship well-being. Sexual activity. Eating habits. History of falls. Memory and ability to understand (cognition). Work and work Astronomer. Screening  You may have the following tests or measurements: Height, weight, and BMI. Blood pressure. Lipid and cholesterol levels. These may be checked every 5 years, or more frequently if you are over 58 years old. Skin check. Lung cancer screening. You may have this screening every year starting at age 68 if you have a 30-pack-year history of smoking and currently smoke or have quit within the past 15 years. Fecal occult blood test (FOBT) of the stool. You may have this test every year starting at age 68. Flexible sigmoidoscopy or colonoscopy. You may have a sigmoidoscopy every 5 years or a colonoscopy every 10 years starting at age 85. Prostate cancer screening. Recommendations will vary depending on your family history and other risks. Hepatitis C blood test. Hepatitis B blood test.  Sexually transmitted disease (STD) testing. Diabetes screening. This is done by checking your blood sugar (glucose) after you have not eaten for a while (fasting). You may have this done every 1-3 years. Abdominal aortic aneurysm (AAA) screening. You may need this if you are a current or former smoker. Osteoporosis. You may be screened starting at age 68 if you are at high risk. Talk with your health care provider about your test results,  treatment options, and if necessary, the need for more tests. Vaccines  Your health care provider may recommend certain vaccines, such as: Influenza vaccine. This is recommended every year. Tetanus, diphtheria, and acellular pertussis (Tdap, Td) vaccine. You may need a Td booster every 10 years. Zoster vaccine. You may need this after age 56. Pneumococcal 13-valent conjugate (PCV13) vaccine. One dose is recommended after age 28. Pneumococcal polysaccharide (PPSV23) vaccine. One dose is recommended after age 31. Talk to your health care provider about which screenings and vaccines you need and how often you need them. This information is not intended to replace advice given to you by your health care provider. Make sure you discuss any questions you have with your health care provider. Document Released: 03/10/2015 Document Revised: 11/01/2015 Document Reviewed: 12/13/2014 Elsevier Interactive Patient Education  2017 Comfort Prevention in the Home Falls can cause injuries. They can happen to people of all ages. There are many things you can do to make your home safe and to help prevent falls. What can I do on the outside of my home? Regularly fix the edges of walkways and driveways and fix any cracks. Remove anything that might make you trip as you walk through a door, such as a raised step or threshold. Trim any bushes or trees on the path to your home. Use bright outdoor lighting. Clear any walking paths of anything that might make someone trip, such as rocks or tools. Regularly check to see if handrails are loose or broken. Make sure that both sides of any steps have handrails. Any raised decks and porches should have guardrails on the edges. Have any leaves, snow, or ice cleared regularly. Use sand or salt on walking paths during winter. Clean up any spills in your garage right away. This includes oil or grease spills. What can I do in the bathroom? Use night  lights. Install grab bars by the toilet and in the tub and shower. Do not use towel bars as grab bars. Use non-skid mats or decals in the tub or shower. If you need to sit down in the shower, use a plastic, non-slip stool. Keep the floor dry. Clean up any water that spills on the floor as soon as it happens. Remove soap buildup in the tub or shower regularly. Attach bath mats securely with double-sided non-slip rug tape. Do not have throw rugs and other things on the floor that can make you trip. What can I do in the bedroom? Use night lights. Make sure that you have a light by your bed that is easy to reach. Do not use any sheets or blankets that are too big for your bed. They should not hang down onto the floor. Have a firm chair that has side arms. You can use this for support while you get dressed. Do not have throw rugs and other things on the floor that can make you trip. What can I do in the kitchen? Clean up any spills right away. Avoid walking on wet floors. Keep items that you  use a lot in easy-to-reach places. If you need to reach something above you, use a strong step stool that has a grab bar. Keep electrical cords out of the way. Do not use floor polish or wax that makes floors slippery. If you must use wax, use non-skid floor wax. Do not have throw rugs and other things on the floor that can make you trip. What can I do with my stairs? Do not leave any items on the stairs. Make sure that there are handrails on both sides of the stairs and use them. Fix handrails that are broken or loose. Make sure that handrails are as long as the stairways. Check any carpeting to make sure that it is firmly attached to the stairs. Fix any carpet that is loose or worn. Avoid having throw rugs at the top or bottom of the stairs. If you do have throw rugs, attach them to the floor with carpet tape. Make sure that you have a light switch at the top of the stairs and the bottom of the stairs. If  you do not have them, ask someone to add them for you. What else can I do to help prevent falls? Wear shoes that: Do not have high heels. Have rubber bottoms. Are comfortable and fit you well. Are closed at the toe. Do not wear sandals. If you use a stepladder: Make sure that it is fully opened. Do not climb a closed stepladder. Make sure that both sides of the stepladder are locked into place. Ask someone to hold it for you, if possible. Clearly mark and make sure that you can see: Any grab bars or handrails. First and last steps. Where the edge of each step is. Use tools that help you move around (mobility aids) if they are needed. These include: Canes. Walkers. Scooters. Crutches. Turn on the lights when you go into a dark area. Replace any light bulbs as soon as they burn out. Set up your furniture so you have a clear path. Avoid moving your furniture around. If any of your floors are uneven, fix them. If there are any pets around you, be aware of where they are. Review your medicines with your doctor. Some medicines can make you feel dizzy. This can increase your chance of falling. Ask your doctor what other things that you can do to help prevent falls. This information is not intended to replace advice given to you by your health care provider. Make sure you discuss any questions you have with your health care provider. Document Released: 12/08/2008 Document Revised: 07/20/2015 Document Reviewed: 03/18/2014 Elsevier Interactive Patient Education  2017 [employer].

## 2022-02-01 NOTE — Progress Notes (Signed)
Subjective:   Charles Hurst is a 66 y.o. male who presents for Medicare Annual/Subsequent preventive examination.  I connected with  Charles Hurst on 02/01/22 by a audio enabled telemedicine application and verified that I am speaking with the correct person using two identifiers.  Patient Location: Home  Provider Location: Office/Clinic  I discussed the limitations of evaluation and management by telemedicine. The patient expressed understanding and agreed to proceed.  Review of Systems     Cardiac Risk Factors include: advanced age (>8men, >50 women);hypertension;male gender;dyslipidemia     Objective:    Today's Vitals   02/01/22 1540  Weight: 160 lb (72.6 kg)  Height: 5\' 10"  (1.778 m)   Body mass index is 22.96 kg/m.     02/01/2022    4:08 PM 01/16/2021    4:01 PM  Advanced Directives  Does Patient Have a Medical Advance Directive? No No  Would patient like information on creating a medical advance directive? Yes (MAU/Ambulatory/Procedural Areas - Information given) No - Patient declined    Current Medications (verified) Outpatient Encounter Medications as of 02/01/2022  Medication Sig   enalapril (VASOTEC) 20 MG tablet Take 1 tablet (20 mg total) by mouth 2 (two) times daily.   hydrochlorothiazide (HYDRODIURIL) 25 MG tablet Take 1 tablet (25 mg total) by mouth daily.   hydrocortisone (PROCTOSOL HC) 2.5 % rectal cream APPLY RECTALLY TWICE A DAY   simvastatin (ZOCOR) 20 MG tablet TAKE ONE TABLET (20MG  TOTAL) BY MOUTH ATBEDTIME   verapamil (CALAN-SR) 240 MG CR tablet TAKE ONE TABLET (240MG  TOTAL) BY MOUTH AT BEDTIME   No facility-administered encounter medications on file as of 02/01/2022.    Allergies (verified) Patient has no known allergies.   History: History reviewed. No pertinent past medical history. No past surgical history on file. No family history on file. Social History   Socioeconomic History   Marital status: Married    Spouse name:    Number of children: 2   Years of education: Not on file   Highest education level: Not on file  Occupational History   Not on file  Tobacco Use   Smoking status: Never   Smokeless tobacco: Never  Substance and Sexual Activity   Alcohol use: Not on file   Drug use: Not on file   Sexual activity: Not on file  Other Topics Concern   Not on file  Social History Narrative   2 sons   1 granddaugther   Social Determinants of Health   Financial Resource Strain: Low Risk  (02/01/2022)   Overall Financial Resource Strain (CARDIA)    Difficulty of Paying Living Expenses: Not hard at all  Food Insecurity: No Food Insecurity (02/01/2022)   Hunger Vital Sign    Worried About Running Out of Food in the Last Year: Never true    Ran Out of Food in the Last Year: Never true  Transportation Needs: No Transportation Needs (02/01/2022)   PRAPARE - 14/09/2021 (Medical): No    Lack of Transportation (Non-Medical): No  Physical Activity: Sufficiently Active (02/01/2022)   Exercise Vital Sign    Days of Exercise per Week: 5 days    Minutes of Exercise per Session: 30 min  Stress: No Stress Concern Present (02/01/2022)   Administrator, [employer] of Occupational Health - Occupational Stress Questionnaire    Feeling of Stress : Not at all  Social Connections: Socially Integrated (02/01/2022)   Social Connection and Isolation Panel [NHANES]  Frequency of Communication with Friends and Family: More than three times a week    Frequency of Social Gatherings with Friends and Family: Three times a week    Attends Religious Services: More than 4 times per year    Active Member of Clubs or Organizations: Yes    Attends Music therapist: More than 4 times per year    Marital Status: Married    Tobacco Counseling Counseling given: Not Answered   Clinical Intake:  Pre-visit preparation completed: Yes  Pain : No/denies pain   Diabetes: No  How often do  you need to have someone help you when you read instructions, pamphlets, or other written materials from your doctor or pharmacy?: 1 - Never  Diabetic?No   Interpreter Needed?: No  Information entered by :: Denman George LPN   Activities of Daily Living    02/01/2022    4:08 PM 01/31/2022   10:42 PM  In your present state of health, do you have any difficulty performing the following activities:  Hearing? 0 0  Vision? 0 0  Difficulty concentrating or making decisions? 0 0  Walking or climbing stairs? 0 0  Dressing or bathing? 0 0  Doing errands, shopping? 0 0  Preparing Food and eating ? N N  Using the Toilet? N N  In the past six months, have you accidently leaked urine? N N  Do you have problems with loss of bowel control? N N  Managing your Medications? N N  Managing your Finances? N N  Housekeeping or managing your Housekeeping? N N    Patient Care Team: Coral Spikes, DO as PCP - General (Family Medicine)  Indicate any recent Medical Services you may have received from other than [facility] providers in the past year (date may be approximate).     Assessment:   This is a routine wellness examination for Charles Hurst.  Hearing/Vision screen Hearing Screening - Comments:: No concerns  Vision Screening - Comments:: Up to date with routine eye exams with Dr. Jorja Loa   Dietary issues and exercise activities discussed: Current Exercise Habits: The patient has a physically strenuous job, but has no regular exercise apart from work.   Goals Addressed             This Visit's Progress    Exercise 3x per week (30 min per time)   On track    Continue to exercise, monitor BP and stay healthy/      Depression Screen    02/01/2022    4:06 PM 12/05/2021    8:36 AM 01/16/2021    3:58 PM 08/24/2020    8:34 AM 11/24/2019    1:05 PM 10/27/2018   10:21 AM 07/31/2017    9:41 AM  PHQ 2/9 Scores  PHQ - 2 Score 0 0 0 0 0 0 0    Fall Risk    02/01/2022    4:05 PM 01/31/2022   10:42 PM  12/05/2021    8:36 AM 01/16/2021    4:01 PM 01/15/2021    8:22 AM  [place] in the past year? 0 0 0 0 0  Number falls in past yr: 0 0 0 0 0  Injury with Fall? 0 0 0 0 0  Risk for fall due to : No Fall Risks  No Fall Risks No Fall Risks   Follow up Falls evaluation completed;Education provided;Falls prevention discussed  Falls evaluation completed Falls prevention discussed     FALL RISK  PREVENTION PERTAINING TO THE HOME:  Any stairs in or around the home? Yes  If so, are there any without handrails? No  Home free of loose throw rugs in walkways, pet beds, electrical cords, etc? Yes  Adequate lighting in your home to reduce risk of falls? Yes   ASSISTIVE DEVICES UTILIZED TO PREVENT FALLS:  Life alert? No  Use of a cane, walker or w/c? No  Grab bars in the bathroom? Yes  Shower chair or bench in shower? No  Elevated toilet seat or a handicapped toilet? No   TIMED UP AND GO:  Was the test performed? No .  Length of time to ambulate 10 feet: telephonic visit    Cognitive Function:        02/01/2022    4:09 PM 01/16/2021    4:02 PM  6CIT Screen  What Year? 0 points 0 points  What month? 0 points 0 points  What time? 0 points 0 points  Count back from 20 0 points 0 points  Months in reverse 0 points 0 points  Repeat phrase 0 points 0 points  Total Score 0 points 0 points    Immunizations Immunization History  Administered Date(s) Administered   Janssen (J&J) SARS-COV-2 Vaccination 05/19/2019    TDAP status: Due, Education has been provided regarding the importance of this vaccine. Advised may receive this vaccine at local pharmacy or Health Dept. Aware to provide a copy of the vaccination record if obtained from local pharmacy or Health Dept. Verbalized acceptance and understanding.  Flu Vaccine status: Due, Education has been provided regarding the importance of this vaccine. Advised may receive this vaccine at local pharmacy or Health Dept. Aware to  provide a copy of the vaccination record if obtained from local pharmacy or Health Dept. Verbalized acceptance and understanding.  Pneumococcal vaccine status: Due, Education has been provided regarding the importance of this vaccine. Advised may receive this vaccine at local pharmacy or Health Dept. Aware to provide a copy of the vaccination record if obtained from local pharmacy or Health Dept. Verbalized acceptance and understanding.  Covid-19 vaccine status: Information provided on how to obtain vaccines.   Qualifies for Shingles Vaccine? Yes   Zostavax completed No   Shingrix Completed?: No.    Education has been provided regarding the importance of this vaccine. Patient has been advised to call insurance company to determine out of pocket expense if they have not yet received this vaccine. Advised may also receive vaccine at local pharmacy or Health Dept. Verbalized acceptance and understanding.  Screening Tests Health Maintenance  Topic Date Due   Hepatitis C Screening  Never done   DTaP/Tdap/Td (1 - Tdap) Never done   Zoster Vaccines- Shingrix (1 of 2) Never done   Pneumonia Vaccine 53+ Years old (1 - PCV) Never done   INFLUENZA VACCINE  Never done   Medicare Annual Wellness (AWV)  02/02/2023   COLONOSCOPY (Pts 45-29yrs Insurance coverage will need to be confirmed)  10/26/2025   HPV VACCINES  Aged Out   COVID-19 Vaccine  Discontinued    Health Maintenance  Health Maintenance Due  Topic Date Due   Hepatitis C Screening  Never done   DTaP/Tdap/Td (1 - Tdap) Never done   Zoster Vaccines- Shingrix (1 of 2) Never done   Pneumonia Vaccine 31+ Years old (1 - PCV) Never done   INFLUENZA VACCINE  Never done    Colorectal cancer screening: Type of screening: Colonoscopy. Completed 10/27/15. Repeat every 10 years  Lung  Cancer Screening: (Low Dose CT Chest recommended if Age 22-80 years, 30 pack-year currently smoking OR have quit w/in 15years.) does not qualify.   Lung Cancer  Screening Referral: n/a  Additional Screening:  Hepatitis C Screening: does qualify; Completed at next office visit   Vision Screening: Recommended annual ophthalmology exams for early detection of glaucoma and other disorders of the eye. Is the patient up to date with their annual eye exam?  Yes  Who is the provider or what is the name of the office in which the patient attends annual eye exams? Dr. Jorja Loa  If pt is not established with a provider, would they like to be referred to a provider to establish care? No .   Dental Screening: Recommended annual dental exams for proper oral hygiene  Community Resource Referral / Chronic Care Management: CRR required this visit?  No   CCM required this visit?  No      Plan:     I have personally reviewed and noted the following in the patient's chart:   Medical and social history Use of alcohol, tobacco or illicit drugs  Current medications and supplements including opioid prescriptions. Patient is not currently taking opioid prescriptions. Functional ability and status Nutritional status Physical activity Advanced directives List of other physicians Hospitalizations, surgeries, and ER visits in previous 12 months Vitals Screenings to include cognitive, depression, and falls Referrals and appointments  In addition, I have reviewed and discussed with patient certain preventive protocols, quality metrics, and best practice recommendations. A written personalized care plan for preventive services as well as general preventive health recommendations were provided to patient.     Vanetta Mulders, [place]   [id number]   Due to this being a virtual visit, the after visit summary with patients personalized plan was offered to patient via mail or my-chart.  Patient would like to access on my-chart  Nurse Notes: No concerns

## 2022-03-05 DIAGNOSIS — R748 Abnormal levels of other serum enzymes: Secondary | ICD-10-CM | POA: Diagnosis not present

## 2022-03-05 DIAGNOSIS — N1831 Chronic kidney disease, stage 3a: Secondary | ICD-10-CM | POA: Diagnosis not present

## 2022-03-06 LAB — BASIC METABOLIC PANEL
BUN/Creatinine Ratio: 12 (ref 10–24)
BUN: 19 mg/dL (ref 8–27)
CO2: 24 mmol/L (ref 20–29)
Calcium: 9.9 mg/dL (ref 8.6–10.2)
Chloride: 98 mmol/L (ref 96–106)
Creatinine, Ser: 1.57 mg/dL — ABNORMAL HIGH (ref 0.76–1.27)
Glucose: 104 mg/dL — ABNORMAL HIGH (ref 70–99)
Potassium: 3.8 mmol/L (ref 3.5–5.2)
Sodium: 139 mmol/L (ref 134–144)
eGFR: 48 mL/min/{1.73_m2} — ABNORMAL LOW (ref >59)

## 2022-03-06 LAB — GAMMA GT: GGT: 35 IU/L (ref 0–65)

## 2022-03-07 ENCOUNTER — Ambulatory Visit (INDEPENDENT_AMBULATORY_CARE_PROVIDER_SITE_OTHER): Payer: Medicare Other | Admitting: Family Medicine

## 2022-03-07 DIAGNOSIS — R748 Abnormal levels of other serum enzymes: Secondary | ICD-10-CM | POA: Diagnosis not present

## 2022-03-07 DIAGNOSIS — I10 Essential (primary) hypertension: Secondary | ICD-10-CM | POA: Diagnosis not present

## 2022-03-07 DIAGNOSIS — N1831 Chronic kidney disease, stage 3a: Secondary | ICD-10-CM | POA: Diagnosis not present

## 2022-03-07 DIAGNOSIS — E785 Hyperlipidemia, unspecified: Secondary | ICD-10-CM | POA: Diagnosis not present

## 2022-03-07 MED ORDER — AMLODIPINE BESYLATE 10 MG PO TABS: 10.0000 mg | ORAL_TABLET | Freq: Every day | ORAL | 3 refills | Status: DC

## 2022-03-07 NOTE — Patient Instructions (Addendum)
Stop Verapamil. Start Norvasc.  Consider the following vaccines: Flu, Pneumococcal, Shingles.  Referral placed.  Keep an eye on your blood pressure.  Follow-up in 3 months

## 2022-03-07 NOTE — Assessment & Plan Note (Signed)
Stable on simvastatin.  Continue.

## 2022-03-07 NOTE — Assessment & Plan Note (Signed)
Not at goal.  Stopping verapamil.  Starting amlodipine.  Follow-up in 3 months.

## 2022-03-07 NOTE — Assessment & Plan Note (Signed)
Normal GGT.  Referring to endocrinology for further workup.

## 2022-03-07 NOTE — Assessment & Plan Note (Signed)
Stable.  Will continue monitor closely.

## 2022-03-07 NOTE — Progress Notes (Signed)
Subjective:  Patient ID: Charles Hurst, male    DOB: 03/01/1955  Age: 67 y.o. MRN: [id number]  CC: Chief Complaint  Patient presents with   Follow-up    Patient would like to discuss his alkaline levels.    HPI:  67 year old male with hypertension, chronic kidney disease stage III, hyperlipidemia presents for follow-up.  Patient has had persistently elevated alkaline phosphatase levels for quite some time.  This proceeded me.  Most recent alkaline phosphatase level was 150.  GGT was normal.  Patient needs referral to endocrinology.  Will discuss today.  Patient's blood pressure is elevated today.  He states that his blood pressures have been elevated at home recently as well.  He is interested in switching verapamil to amlodipine.  He is also on enalapril and hydrochlorothiazide.  Renal function has been stable.  He was previously seen by nephrology in 2019.  His CKD was thought to be from hypertension.  Had ultrasound negative for renal artery stenosis.  Hyperlipidemia has been stable on simvastatin.  Tolerating.  In regards to his health maintenance, I advised him to get a flu vaccine and pneumonia vaccine.  Also recommended shingles vaccine.  Patient states that he will get these but not today.  Patient Active Problem List   Diagnosis Date Noted   Elevated alkaline phosphatase level 03/07/2022   Stage 3a chronic kidney disease ([place]) 06/05/2021   Essential hypertension, benign 12/13/2012   Hyperlipidemia 12/13/2012    Social Hx   Social History   Socioeconomic History   Marital status: Married    Spouse name: Estill Bamberg   Number of children: 2   Years of education: Not on file   Highest education level: Not on file  Occupational History   Not on file  Tobacco Use   Smoking status: Never   Smokeless tobacco: Never  Substance and Sexual Activity   Alcohol use: Not on file   Drug use: Not on file   Sexual activity: Not on file  Other Topics Concern   Not on file   Social History Narrative   2 sons   1 granddaugther   Social Determinants of Health   Financial Resource Strain: Low Risk  (02/01/2022)   Overall Financial Resource Strain (CARDIA)    Difficulty of Paying Living Expenses: Not hard at all  Food Insecurity: No Food Insecurity (02/01/2022)   Hunger Vital Sign    Worried About Running Out of Food in the Last Year: Never true    [place] in the Last Year: Never true  Transportation Needs: No Transportation Needs (02/01/2022)   PRAPARE - Hydrologist (Medical): No    Lack of Transportation (Non-Medical): No  Physical Activity: Sufficiently Active (02/01/2022)   Exercise Vital Sign    Days of Exercise per Week: 5 days    Minutes of Exercise per Session: 30 min  Stress: No Stress Concern Present (02/01/2022)   [place]    Feeling of Stress : Not at all  Social Connections: [place] (02/01/2022)   Social Connection and Isolation Panel [NHANES]    Frequency of Communication with Friends and Family: More than three times a week    Frequency of Social Gatherings with Friends and Family: Three times a week    Attends Religious Services: More than 4 times per year    Active Member of Clubs or Organizations: Yes    Attends Archivist Meetings:  More than 4 times per year    Marital Status: Married    Review of Systems Per HPI  Objective:  BP (!) 154/73   Pulse 81   Temp 97.9 F (36.6 C) (Oral)   Ht 5\' 10"  (1.778 m)   Wt 162 lb 9.6 oz (73.8 kg)   SpO2 99%   BMI 23.33 kg/m      03/07/2022    8:43 AM 03/07/2022    8:13 AM 03/07/2022    8:05 AM  BP/Weight  Systolic BP 841 660 630  Diastolic BP 73 81 77  Wt. (Lbs)   162.6  BMI   23.33 kg/m2    Physical Exam Vitals and nursing note reviewed.  Constitutional:      General: He is not in acute distress.    Appearance: Normal appearance.  HENT:     Head:  Normocephalic and atraumatic.  Eyes:     General:        Right eye: No discharge.        Left eye: No discharge.     Conjunctiva/sclera: Conjunctivae normal.  Cardiovascular:     Rate and Rhythm: Normal rate and regular rhythm.  Pulmonary:     Effort: Pulmonary effort is normal.     Breath sounds: Normal breath sounds. No wheezing, rhonchi or rales.  Neurological:     Mental Status: He is alert.  Psychiatric:        Mood and Affect: Mood normal.        Behavior: Behavior normal.     Lab Results  Component Value Date   WBC 5.1 12/03/2021   HGB 14.4 12/03/2021   HCT 40.7 12/03/2021   PLT 259 12/03/2021   GLUCOSE 104 (H) 03/05/2022   CHOL 180 12/03/2021   TRIG 67 12/03/2021   HDL 75 12/03/2021   LDLCALC 92 12/03/2021   ALT 28 12/03/2021   AST 33 12/03/2021   NA 139 03/05/2022   K 3.8 03/05/2022   CL 98 03/05/2022   CREATININE 1.57 (H) 03/05/2022   BUN 19 03/05/2022   CO2 24 03/05/2022   PSA 0.81 09/09/2013   HGBA1C 5.4 12/03/2021     Assessment & Plan:   Problem List Items Addressed This Visit       Cardiovascular and Mediastinum   Essential hypertension, benign    Not at goal.  Stopping verapamil.  Starting amlodipine.  Follow-up in 3 months.      Relevant Medications   amLODipine (NORVASC) 10 MG tablet     Genitourinary   Stage 3a chronic kidney disease (HCC)    Stable.  Will continue monitor closely.        Other   Elevated alkaline phosphatase level    Normal GGT.  Referring to endocrinology for further workup.      Relevant Orders   Ambulatory referral to Endocrinology   Hyperlipidemia    Stable on simvastatin.  Continue.      Relevant Medications   amLODipine (NORVASC) 10 MG tablet    Meds ordered this encounter  Medications   amLODipine (NORVASC) 10 MG tablet    Sig: Take 1 tablet (10 mg total) by mouth daily.    Dispense:  90 tablet    Refill:  3    Follow-up:  Return in about 3 months (around 06/06/2022).  [place]

## 2022-03-19 NOTE — Progress Notes (Signed)
Name: Charles Hurst  MRN/ DOB: [id number], Jul 24, 1955    Age/ Sex: 67 y.o., male    PCP: Charles Sams, DO   Reason for Endocrinology Evaluation: Elevated alkaline phosphatase     Date of Initial Endocrinology Evaluation: 03/20/2022     HPI: [name] is a 67 y.o. male with a past medical history of HTN, Dyslipidemia and CKD III. The patient presented for initial endocrinology clinic visit on 03/20/2022 for consultative assistance with his elevated alkaline phosphatase.   Patient has been noted with elevated alkaline phosphatase since 2017.  This has been fluctuating over the years.    No prior dx of fatty liver  No prior hx of osteoporosis , denies bone fracture  Weight stable except recently had increased  Denies constipation or diarrhea  Denies tremors  Denies renal stones  NO supplements except garlic pills  No ETOH of tobacco use     He is on MVI   No Fh of elevated alkaline phosphatase    HISTORY:  Past Medical History: No past medical history on file. Past Surgical History: No past surgical history on file.  Social History:  reports that he has never smoked. He has never used smokeless tobacco. Family History: family history is not on file.   HOME MEDICATIONS: Allergies as of 03/20/2022   No Known Allergies      Medication List        Accurate as of March 20, 2022 10:04 AM. If you have any questions, ask your nurse or doctor.          amLODipine 10 MG tablet Commonly known as: NORVASC Take 1 tablet (10 mg total) by mouth daily.   enalapril 20 MG tablet Commonly known as: VASOTEC Take 1 tablet (20 mg total) by mouth 2 (two) times daily.   hydrochlorothiazide 25 MG tablet Commonly known as: HYDRODIURIL Take 1 tablet (25 mg total) by mouth daily.   hydrocortisone 2.5 % rectal cream Commonly known as: Proctosol HC APPLY RECTALLY TWICE A DAY   simvastatin 20 MG tablet Commonly known as: ZOCOR TAKE ONE TABLET (20MG  TOTAL) BY  MOUTH ATBEDTIME          REVIEW OF SYSTEMS: A comprehensive ROS was conducted with the patient and is negative except as per HPI and below:  ROS     OBJECTIVE:  VS: BP (!) 144/90 (BP Location: Left Arm, Patient Position: Sitting, Cuff Size: Small)   Pulse (!) 104   Ht 5\' 10"  (1.778 m)   Wt 163 lb (73.9 kg)   SpO2 98%   BMI 23.39 kg/m    Wt Readings from Last 3 Encounters:  03/20/22 163 lb (73.9 kg)  03/07/22 162 lb 9.6 oz (73.8 kg)  02/01/22 160 lb (72.6 kg)     EXAM: General: Pt appears well and is in NAD  Eyes: External eye exam normal without stare, lid lag or exophthalmos.  EOM intact.  PERRL.  Neck: General: Supple without adenopathy. Thyroid: Thyroid size normal.  No goiter or nodules appreciated. No thyroid bruit.  Lungs: Clear with good BS bilat with no rales, rhonchi, or wheezes  Heart: Auscultation: RRR.  Abdomen: Normoactive bowel sounds, soft, nontender, without masses or organomegaly palpable  Extremities:  BL LE: No pretibial edema normal ROM and strength.  Mental Status: Judgment, insight: Intact Orientation: Oriented to time, place, and person Mood and affect: No depression, anxiety, or agitation     DATA REVIEWED:     Latest  Reference Range & Units 03/20/22 10:21  Sodium 135 - 145 mEq/L 141  Potassium 3.5 - 5.1 mEq/L 3.9  Chloride 96 - 112 mEq/L 103  CO2 19 - 32 mEq/L 30  Glucose 70 - 99 mg/dL 161 (H)  BUN 6 - 23 mg/dL 19  Creatinine 0.96 - 0.45 mg/dL 4.09 (H)  Calcium 8.4 - 10.5 mg/dL 81.1  Alkaline Phosphatase 44 - 121 IU/L 131 (H)  Albumin 3.5 - 5.2 g/dL 4.8  GFR >91.47 mL/min 39.90 (L)    Latest Reference Range & Units 03/20/22 10:21  BONE FRACTION  WILL FOLLOW (P)  INTESTINAL FRAC.  WILL FOLLOW (P)  LIVER FRACTION  WILL FOLLOW (P)  VITD 30.00 - 100.00 ng/mL 28.50 (L)    Latest Reference Range & Units 03/20/22 10:21  PTH, Intact 16 - 77 pg/mL 37  TSH 0.35 - 5.50 uIU/mL 0.74  T4,Free(Direct) 0.60 - 1.60 ng/dL 8.29     ASSESSMENT/PLAN/RECOMMENDATIONS:   Elevated alkaline phosphatase:   - Elevated Alkaline phosphatase could be of hepatic, intestinal or bone origin. - Elevated Alkaline phosphatase of bone origin is due to increase osteoblastic activity.  - Causes include : hyperthyroidism, hyperparathyroidism, osteomalacia , recent fractures, paget's disease or familial causes.   -Labs today show normal TFTs, calcium, PTH -Awaiting on alkaline phosphatase isoenzymes     2. Vitamin D Insufficieny :  -Patient to start over-the-counter vitamin D3 at 1000 IU daily   Follow-up in 4 months Signed electronically by: Charles Herrlich, MD  [facility]  [facility] [address] [place] [place], [place] [address] Phone: [phone] FAX: [phone]   CC: Charles Sams, DO [address] [place] [address] Phone: [phone] Fax: [phone]   Return to Endocrinology clinic as below: Future Appointments  Date Time Provider Department Center  03/20/2022 10:30 AM Charles Hurst, Charles Dolores, MD [facility] None  06/06/2022  9:20 AM Charles Sams, DO [facility]  02/14/2023  3:00 PM [facility]

## 2022-03-20 ENCOUNTER — Ambulatory Visit: Payer: Medicare Other | Admitting: Internal Medicine

## 2022-03-20 ENCOUNTER — Encounter: Payer: Self-pay | Admitting: Internal Medicine

## 2022-03-20 VITALS — BP 144/90 | HR 104 | Ht 70.0 in | Wt 163.0 lb

## 2022-03-20 DIAGNOSIS — R748 Abnormal levels of other serum enzymes: Secondary | ICD-10-CM

## 2022-03-20 LAB — BASIC METABOLIC PANEL
BUN: 19 mg/dL (ref 6–23)
CO2: 30 mEq/L (ref 19–32)
Calcium: 10.1 mg/dL (ref 8.4–10.5)
Chloride: 103 mEq/L (ref 96–112)
Creatinine, Ser: 1.74 mg/dL — ABNORMAL HIGH (ref 0.40–1.50)
GFR: 39.9 mL/min — ABNORMAL LOW (ref >60.00)
Glucose, Bld: 117 mg/dL — ABNORMAL HIGH (ref 70–99)
Potassium: 3.9 mEq/L (ref 3.5–5.1)
Sodium: 141 mEq/L (ref 135–145)

## 2022-03-20 LAB — VITAMIN D 25 HYDROXY (VIT D DEFICIENCY, FRACTURES): VITD: 28.5 ng/mL — ABNORMAL LOW (ref 30.00–100.00)

## 2022-03-20 LAB — TSH: TSH: 0.74 u[IU]/mL (ref 0.35–5.50)

## 2022-03-20 LAB — T4, FREE: Free T4: 0.8 ng/dL (ref 0.60–1.60)

## 2022-03-20 LAB — ALBUMIN: Albumin: 4.8 g/dL (ref 3.5–5.2)

## 2022-03-20 NOTE — Patient Instructions (Signed)
- 

## 2022-03-21 LAB — PARATHYROID HORMONE, INTACT (NO CA): PTH: 37 pg/mL (ref 16–77)

## 2022-03-25 LAB — ALKALINE PHOSPHATASE, ISOENZYMES
Alkaline Phosphatase: 131 IU/L — ABNORMAL HIGH (ref 44–121)
BONE FRACTION: 25 % (ref 12–68)
INTESTINAL FRAC.: 7 % (ref 0–18)
LIVER FRACTION: 68 % (ref 13–88)

## 2022-03-27 ENCOUNTER — Telehealth: Payer: Self-pay | Admitting: Internal Medicine

## 2022-03-27 DIAGNOSIS — R748 Abnormal levels of other serum enzymes: Secondary | ICD-10-CM

## 2022-03-27 NOTE — Telephone Encounter (Signed)
Ordered faxed to [facility]

## 2022-03-27 NOTE — Telephone Encounter (Signed)
04/02/22 @9am  No metal buttons No calcium supplement

## 2022-03-27 NOTE — Telephone Encounter (Signed)
Patient advised of appointment.  

## 2022-03-27 NOTE — Telephone Encounter (Signed)
  Discussed alkaline phosphatase assess times with the patient on 03/27/2022 at 9 AM  His alkaline phosphatase isoenzymes are within normal range Vitamin D is low, and he was started on OTC vitamin D3 1000 IU daily   Latest Reference Range & Units 03/20/22 10:21  BONE FRACTION 12 - 68 % 25  INTESTINAL FRAC. 0 - 18 % 7  LIVER FRACTION 13 - 88 % 68  VITD 30.00 - 100.00 ng/mL 28.50 (L)    TFTs, PTH, and calcium have come back normal   I would recommend proceeding with DXA scan, which the patient is in agreement of    Will schedule bone density at [facility] mammography   .sig

## 2022-04-02 DIAGNOSIS — M85851 Other specified disorders of bone density and structure, right thigh: Secondary | ICD-10-CM | POA: Diagnosis not present

## 2022-04-02 LAB — HM DEXA SCAN: HM Dexa Scan: POSITIVE

## 2022-04-10 ENCOUNTER — Encounter: Payer: Self-pay | Admitting: Internal Medicine

## 2022-04-11 ENCOUNTER — Encounter: Payer: Self-pay | Admitting: Internal Medicine

## 2022-06-06 ENCOUNTER — Ambulatory Visit: Payer: Medicare Other | Admitting: Family Medicine

## 2022-07-03 ENCOUNTER — Ambulatory Visit (INDEPENDENT_AMBULATORY_CARE_PROVIDER_SITE_OTHER): Payer: Medicare Other | Admitting: Family Medicine

## 2022-07-03 VITALS — BP 137/75 | Ht 70.0 in | Wt 155.4 lb

## 2022-07-03 DIAGNOSIS — I10 Essential (primary) hypertension: Secondary | ICD-10-CM | POA: Diagnosis not present

## 2022-07-03 DIAGNOSIS — N1831 Chronic kidney disease, stage 3a: Secondary | ICD-10-CM

## 2022-07-03 DIAGNOSIS — E785 Hyperlipidemia, unspecified: Secondary | ICD-10-CM | POA: Diagnosis not present

## 2022-07-03 NOTE — Patient Instructions (Signed)
Continue your medications.  Follow up in 3-6 months.  Take care  Dr. Sherrey North  

## 2022-07-04 NOTE — Progress Notes (Signed)
Subjective:  Patient ID: Charles Hurst, male    DOB: [birth year]  Age: 67 y.o. MRN: [id number]  CC: Chief Complaint  Patient presents with   Hypertension    Follow up Doing good    HPI:  67 year old male with hypertension, CKD stage III, elevated alkaline phosphatase with negative workup, and hyperlipidemia presents for follow-up.  Patient states that he is doing well.  He has no particular complaints or concerns at this time.  He is very pleased with his improvement in his blood pressure readings.  Recent switch to amlodipine has got him under control.  He is compliant with amlodipine, enalapril, and HCTZ.  Patient compliant with simvastatin regarding hyperlipidemia.  Patient Active Problem List   Diagnosis Date Noted   Elevated alkaline phosphatase level 03/07/2022   Stage 3a chronic kidney disease (HCC) 06/05/2021   Essential hypertension, benign 12/13/2012   Hyperlipidemia 12/13/2012    Social Hx   Social History   Socioeconomic History   Marital status: Married    Spouse name: Renea Ee   Number of children: 2   Years of education: Not on file   Highest education level: Bachelor's degree (e.g., BA, AB, BS)  Occupational History   Not on file  Tobacco Use   Smoking status: Never   Smokeless tobacco: Never  Substance and Sexual Activity   Alcohol use: Not on file   Drug use: Not on file   Sexual activity: Not on file  Other Topics Concern   Not on file  Social History Narrative   2 sons   1 granddaugther   Social Determinants of Health   Financial Resource Strain: Low Risk  (02/01/2022)   Overall Financial Resource Strain (CARDIA)    Difficulty of Paying Living Expenses: Not hard at all  Food Insecurity: No Food Insecurity (02/01/2022)   Hunger Vital Sign    Worried About Running Out of Food in the Last Year: Never true    Ran Out of Food in the Last Year: Never true  Transportation Needs: No Transportation Needs (06/29/2022)   PRAPARE - Therapist, art (Medical): No    Lack of Transportation (Non-Medical): No  Physical Activity: Sufficiently Active (06/29/2022)   Exercise Vital Sign    Days of Exercise per Week: 5 days    Minutes of Exercise per Session: 30 min  Stress: No Stress Concern Present (06/29/2022)   [employer] of Occupational Health - Occupational Stress Questionnaire    Feeling of Stress : Not at all  Social Connections: Socially Integrated (06/29/2022)   Social Connection and Isolation Panel [NHANES]    Frequency of Communication with Friends and Family: Three times a week    Frequency of Social Gatherings with Friends and Family: Three times a week    Attends Religious Services: More than 4 times per year    Active Member of Clubs or Organizations: Yes    Attends Banker Meetings: More than 4 times per year    Marital Status: Married    Review of Systems  Constitutional: Negative.   Respiratory: Negative.    Cardiovascular: Negative.    Per HPI  Objective:  BP 137/75   Ht 5\' 10"  (1.778 m)   Wt 155 lb 6.4 oz (70.5 kg)   BMI 22.30 kg/m      07/03/2022   10:16 AM 03/20/2022    9:56 AM 03/07/2022    8:43 AM  BP/Weight  Systolic BP 137 144 154  Diastolic  BP 75 90 73  Wt. (Lbs) 155.4 163   BMI 22.3 kg/m2 23.39 kg/m2     Physical Exam Vitals and nursing note reviewed.  Constitutional:      Appearance: Normal appearance.  HENT:     Head: Normocephalic and atraumatic.  Cardiovascular:     Rate and Rhythm: Normal rate and regular rhythm.  Pulmonary:     Effort: Pulmonary effort is normal.     Breath sounds: Normal breath sounds. No wheezing, rhonchi or rales.  Neurological:     Mental Status: He is alert.  Psychiatric:        Mood and Affect: Mood normal.        Behavior: Behavior normal.     Lab Results  Component Value Date   WBC 5.1 12/03/2021   HGB 14.4 12/03/2021   HCT 40.7 12/03/2021   PLT 259 12/03/2021   GLUCOSE 117 (H) 03/20/2022   CHOL 180  12/03/2021   TRIG 67 12/03/2021   HDL 75 12/03/2021   LDLCALC 92 12/03/2021   ALT 28 12/03/2021   AST 33 12/03/2021   NA 141 03/20/2022   K 3.9 03/20/2022   CL 103 03/20/2022   CREATININE 1.74 (H) 03/20/2022   BUN 19 03/20/2022   CO2 30 03/20/2022   TSH 0.74 03/20/2022   PSA 0.81 09/09/2013   HGBA1C 5.4 12/03/2021     Assessment & Plan:   Problem List Items Addressed This Visit       Cardiovascular and Mediastinum   Essential hypertension, benign - Primary    Well-controlled.  Continue amlodipine, enalapril, and HCTZ.        Genitourinary   Stage 3a chronic kidney disease (HCC)    Will continue to monitor.  May need to see nephrology in the near future.        Other   Hyperlipidemia    Continue statin.  Will assess at next visit.      Follow-up:  3-6 months  Lillyann Ahart Adriana Simas DO [facility]

## 2022-07-04 NOTE — Assessment & Plan Note (Signed)
Well-controlled.  Continue amlodipine, enalapril, and HCTZ.

## 2022-07-04 NOTE — Assessment & Plan Note (Signed)
Continue statin.  Will assess at next visit.

## 2022-07-04 NOTE — Assessment & Plan Note (Signed)
Will continue to monitor.  May need to see nephrology in the near future.

## 2022-08-22 ENCOUNTER — Encounter: Payer: Self-pay | Admitting: Internal Medicine

## 2022-08-22 ENCOUNTER — Ambulatory Visit: Payer: Medicare Other | Admitting: Internal Medicine

## 2022-08-22 VITALS — BP 120/80 | HR 78 | Ht 70.0 in | Wt 154.0 lb

## 2022-08-22 DIAGNOSIS — R748 Abnormal levels of other serum enzymes: Secondary | ICD-10-CM | POA: Diagnosis not present

## 2022-08-22 DIAGNOSIS — R7401 Elevation of levels of liver transaminase levels: Secondary | ICD-10-CM | POA: Diagnosis not present

## 2022-08-22 DIAGNOSIS — E559 Vitamin D deficiency, unspecified: Secondary | ICD-10-CM

## 2022-08-22 LAB — VITAMIN D 25 HYDROXY (VIT D DEFICIENCY, FRACTURES): VITD: 39.99 ng/mL (ref 30.00–100.00)

## 2022-08-22 LAB — COMPREHENSIVE METABOLIC PANEL
ALT: 35 U/L (ref 0–53)
AST: 44 U/L — ABNORMAL HIGH (ref 0–37)
Albumin: 4.8 g/dL (ref 3.5–5.2)
Alkaline Phosphatase: 108 U/L (ref 39–117)
BUN: 16 mg/dL (ref 6–23)
CO2: 34 mEq/L — ABNORMAL HIGH (ref 19–32)
Calcium: 11 mg/dL — ABNORMAL HIGH (ref 8.4–10.5)
Chloride: 101 mEq/L (ref 96–112)
Creatinine, Ser: 1.71 mg/dL — ABNORMAL HIGH (ref 0.40–1.50)
GFR: 40.62 mL/min — ABNORMAL LOW (ref >60.00)
Glucose, Bld: 103 mg/dL — ABNORMAL HIGH (ref 70–99)
Potassium: 4 mEq/L (ref 3.5–5.1)
Sodium: 143 mEq/L (ref 135–145)
Total Bilirubin: 0.7 mg/dL (ref 0.2–1.2)
Total Protein: 7.4 g/dL (ref 6.0–8.3)

## 2022-08-22 LAB — PHOSPHORUS: Phosphorus: 2.6 mg/dL (ref 2.3–4.6)

## 2022-08-22 LAB — MAGNESIUM: Magnesium: 1.9 mg/dL (ref 1.5–2.5)

## 2022-08-22 NOTE — Progress Notes (Signed)
Name: Charles Hurst  MRN/ DOB: [id number], 04/25/1955    Age/ Sex: 67 y.o., male    PCP: Tommie Sams, DO   Reason for Endocrinology Evaluation: Elevated alkaline phosphatase     Date of Initial Endocrinology Evaluation: 03/20/2022    HPI: Charles Hurst is a 67 y.o. male with a past medical history of HTN, Dyslipidemia and CKD III. The patient presented for initial endocrinology clinic visit on 03/20/2022 for consultative assistance with his elevated alkaline phosphatase.   Patient has been noted with elevated alkaline phosphatase since 2017.  This has been fluctuating over the years.   On his initial visit he denied any prior diagnosis of fatty liver, osteoporosis, bone fractures  No history of kidney stones No EtOH or tobacco use   No Fh of elevated alkaline phosphatase    Alkaline phosphatase isoenzymes showed 68% of hepatic origin, 25% bone origin, and 7% intestinal origin  Vitamin D was low and he was started on OTC vitamin D3 1000 IU daily  Otherwise, his GGT, TFTs, and PTH have come back normal   DXA scan 03/2022 showed osteopenia  SUBJECTIVE:    Today (08/22/22):  [name] is here for follow-up on elevated alkaline phosphatase  No recent fall No bone fractures  Denies abdominal pain, constipation or diarrhea  No ETOH    MVI  Vitamin D 1000 IU  daily  Tums 1 tab daily   HISTORY:  Past Medical History: No past medical history on file. Past Surgical History: No past surgical history on file.  Social History:  reports that he has never smoked. He has never used smokeless tobacco. Family History: family history is not on file.   HOME MEDICATIONS: Allergies as of 08/22/2022   No Known Allergies      Medication List        Accurate as of August 22, 2022  8:47 AM. If you have any questions, ask your nurse or doctor.          amLODipine 10 MG tablet Commonly known as: NORVASC Take 1 tablet (10 mg total) by mouth daily.   enalapril  20 MG tablet Commonly known as: VASOTEC Take 1 tablet (20 mg total) by mouth 2 (two) times daily.   hydrochlorothiazide 25 MG tablet Commonly known as: HYDRODIURIL Take 1 tablet (25 mg total) by mouth daily.   simvastatin 20 MG tablet Commonly known as: ZOCOR TAKE ONE TABLET (20MG  TOTAL) BY MOUTH ATBEDTIME          REVIEW OF SYSTEMS: A comprehensive ROS was conducted with the patient and is negative except as per HPI   OBJECTIVE:  VS: BP 120/80 (BP Location: Left Arm, Patient Position: Sitting, Cuff Size: Small)   Pulse 78   Ht 5\' 10"  (1.778 m)   Wt 154 lb (69.9 kg)   SpO2 99%   BMI 22.10 kg/m    Wt Readings from Last 3 Encounters:  08/22/22 154 lb (69.9 kg)  07/03/22 155 lb 6.4 oz (70.5 kg)  03/20/22 163 lb (73.9 kg)     EXAM: General: Pt appears well and is in NAD  Neck: General: Supple without adenopathy. Thyroid: Thyroid size normal.  No goiter or nodules appreciated.   Lungs: Clear with good BS bilat   Heart: Auscultation: RRR.  Abdomen: Soft, nontender  Extremities:  BL LE: No pretibial edema   Mental Status: Judgment, insight: Intact Orientation: Oriented to time, place, and person Mood and affect: No depression, anxiety, or  agitation     DATA REVIEWED:    Latest Reference Range & Units 08/22/22 09:12  Sodium 135 - 145 mEq/L 143  Potassium 3.5 - 5.1 mEq/L 4.0  Chloride 96 - 112 mEq/L 101  CO2 19 - 32 mEq/L 34 (H)  Glucose 70 - 99 mg/dL 161 (H)  BUN 6 - 23 mg/dL 16  Creatinine 0.96 - 0.45 mg/dL 4.09 (H)  Calcium 8.4 - 10.5 mg/dL 81.1 (H)  Phosphorus 2.3 - 4.6 mg/dL 2.6  Magnesium 1.5 - 2.5 mg/dL 1.9  Alkaline Phosphatase 39 - 117 U/L 108  Albumin 3.5 - 5.2 g/dL 4.8  AST 0 - 37 U/L 44 (H)  ALT 0 - 53 U/L 35  Total Protein 6.0 - 8.3 g/dL 7.4  Total Bilirubin 0.2 - 1.2 mg/dL 0.7  GFR >91.47 mL/min 40.62 (L)    Latest Reference Range & Units 08/22/22 09:12  VITD 30.00 - 100.00 ng/mL 39.99  Glucose 70 - 99 mg/dL 829 (H)  (H): Data is  abnormally high       Latest Reference Range & Units 03/20/22 10:21  BONE FRACTION 12 - 68 % 25  INTESTINAL FRAC. 0 - 18 % 7  LIVER FRACTION 13 - 88 % 68  VITD 30.00 - 100.00 ng/mL 28.50 (L)      DXA 04/02/2022   T-Score  RFN -1.1  Right total Hip -0.9  LFN -1.0  Left total Hip -0.7  Left 1/3 radius +0.5    ASSESSMENT/PLAN/RECOMMENDATIONS:   Elevated alkaline phosphatase:   - Elevated Alkaline phosphatase isoenzymes showed 68% liver fraction, 25% bone fraction and 7% intestinal fraction, based on this there is NO  evidence showing that her alkaline phosphatase is of bone origin at this time, which is beyond the scope of endocrinology -DXA scan showed low bone density -Labs today show shows normalization of alkaline phosphatase      2. Vitamin D Insufficieny :   -Resolved Continue vitamin D3 1000 IU daily  3. CKD III:  -He was seen by nephrology in [place] at some point -Renal function is improving -Phosphorus and magnesium normal   4.  Low bone density:  -Encourage optimization of calcium and vitamin D intake to prevent progression to osteoporosis -Encouraged weightbearing exercises    5.  Elevated AST:  -This is new, and in the setting of previously elevated alkaline phosphatase was 68% of hepatic origin I have recommended proceeding with hepatic ultrasound -He uses Tylenol sporadically -He does not consume alcohol  6.  Elevated serum calcium:  -Corrected calcium is normal at 10.36 -Will repeat labs in a month to include serum calcium, albumin, and ionized calcium as well as PTH -He will continue calcium and vitamin D intake at this time  Recommend continuation of current Tx with primary MD and consultative f/u at [facility] in the future if needed    Addendum : Spoke to the pt on 08/23/2022 1700 discussed lab results and plans as above.  He is scheduled for repeat labs 09/30/2022   signed electronically by: Lyndle Herrlich, MD  [facility]  [facility] [address] [place] [place], [place] [address] Phone: [phone] FAX: [phone]   CC: Tommie Sams, DO [address] [place] [address] Phone: [phone] Fax: [phone]   Return to Endocrinology clinic as below: Future Appointments  Date Time Provider Department Center  08/22/2022  8:50 AM Malvina Schadler, Konrad Dolores, MD [facility] None  01/03/2023  9:40 AM Everlene Other  G, DO [facility]  02/14/2023  3:00 PM [facility] VISIT [facility]

## 2022-08-22 NOTE — Patient Instructions (Signed)
Please take between 1000-1200 mg of calcium daily  Continue Vitamin D3 1000 international units daily

## 2022-08-23 ENCOUNTER — Telehealth: Payer: Self-pay | Admitting: Internal Medicine

## 2022-08-24 ENCOUNTER — Other Ambulatory Visit: Payer: Self-pay | Admitting: Family Medicine

## 2022-09-23 ENCOUNTER — Ambulatory Visit: Admission: RE | Admit: 2022-09-23 | Payer: Medicare Other | Source: Ambulatory Visit

## 2022-09-23 DIAGNOSIS — R7401 Elevation of levels of liver transaminase levels: Secondary | ICD-10-CM

## 2022-09-23 DIAGNOSIS — R7989 Other specified abnormal findings of blood chemistry: Secondary | ICD-10-CM | POA: Diagnosis not present

## 2022-09-30 ENCOUNTER — Other Ambulatory Visit (INDEPENDENT_AMBULATORY_CARE_PROVIDER_SITE_OTHER): Payer: Medicare Other

## 2022-09-30 LAB — ALBUMIN: Albumin: 4.7 g/dL (ref 3.5–5.2)

## 2022-10-03 LAB — PTH, INTACT AND CALCIUM
Calcium: 10.3 mg/dL (ref 8.6–10.3)
PTH: 19 pg/mL (ref 16–77)

## 2022-10-09 NOTE — Telephone Encounter (Signed)
error 

## 2023-01-03 ENCOUNTER — Encounter: Payer: Self-pay | Admitting: Family Medicine

## 2023-01-03 ENCOUNTER — Ambulatory Visit (INDEPENDENT_AMBULATORY_CARE_PROVIDER_SITE_OTHER): Payer: Medicare Other | Admitting: Family Medicine

## 2023-01-03 VITALS — BP 136/71 | HR 102 | Temp 97.5°F | Ht 70.0 in | Wt 155.0 lb

## 2023-01-03 DIAGNOSIS — Z125 Encounter for screening for malignant neoplasm of prostate: Secondary | ICD-10-CM

## 2023-01-03 DIAGNOSIS — N1831 Chronic kidney disease, stage 3a: Secondary | ICD-10-CM | POA: Diagnosis not present

## 2023-01-03 DIAGNOSIS — E785 Hyperlipidemia, unspecified: Secondary | ICD-10-CM | POA: Diagnosis not present

## 2023-01-03 DIAGNOSIS — I10 Essential (primary) hypertension: Secondary | ICD-10-CM | POA: Diagnosis not present

## 2023-01-03 MED ORDER — HYDROCHLOROTHIAZIDE 25 MG PO TABS: 25.0000 mg | ORAL_TABLET | Freq: Every day | ORAL | 3 refills | Status: DC

## 2023-01-03 MED ORDER — AMLODIPINE BESYLATE 10 MG PO TABS: 10.0000 mg | ORAL_TABLET | Freq: Every day | ORAL | 3 refills | Status: DC

## 2023-01-03 MED ORDER — SIMVASTATIN 20 MG PO TABS: ORAL_TABLET | ORAL | 3 refills | Status: DC

## 2023-01-03 MED ORDER — ENALAPRIL MALEATE 20 MG PO TABS: 20.0000 mg | ORAL_TABLET | Freq: Two times a day (BID) | ORAL | 3 refills | Status: DC

## 2023-01-03 NOTE — Patient Instructions (Signed)
Meds refilled.  Follow up in 6 months.  Take care  Dr. Adriana Simas

## 2023-01-03 NOTE — Progress Notes (Signed)
Subjective:  Patient ID: Charles Hurst, male    DOB: [birth year]  Age: 67 y.o. MRN: [id number]  CC:   Chief Complaint  Patient presents with   Hypertension    HPI:  67 year old male comes for follow-up.  Hypertension improved stable.  Home readings at goal.  He is compliant with amlodipine, enalapril, hydrochlorothiazide.  Patient needs reassessment of lipids.  Tolerating statin.  Has been seen by endocrinology regarding elevated alk phos.  Right upper quadrant ultrasound revealed steatosis.  Will continue to monitor.  Patient reports recent left-sided low back pain.  He would like me to examine the area today.  No known fall, trauma, injury.  Patient Active Problem List   Diagnosis Date Noted   Vitamin D insufficiency 08/22/2022   Elevated alkaline phosphatase level 03/07/2022   Stage 3a chronic kidney disease (HCC) 06/05/2021   Essential hypertension, benign 12/13/2012   Hyperlipidemia 12/13/2012    Social Hx   Social History   Socioeconomic History   Marital status: Married    Spouse name: Renea Ee   Number of children: 2   Years of education: Not on file   Highest education level: Bachelor's degree (e.g., BA, AB, BS)  Occupational History   Not on file  Tobacco Use   Smoking status: Never   Smokeless tobacco: Never  Substance and Sexual Activity   Alcohol use: Not on file   Drug use: Not on file   Sexual activity: Not on file  Other Topics Concern   Not on file  Social History Narrative   2 sons   1 granddaugther   Social Determinants of Health   Financial Resource Strain: Low Risk  (02/01/2022)   Overall Financial Resource Strain (CARDIA)    Difficulty of Paying Living Expenses: Not hard at all  Food Insecurity: No Food Insecurity (02/01/2022)   Hunger Vital Sign    Worried About Running Out of Food in the Last Year: Never true    Ran Out of Food in the Last Year: Never true  Transportation Needs: No Transportation Needs (06/29/2022)   PRAPARE -  Administrator, Civil Service (Medical): No    Lack of Transportation (Non-Medical): No  Physical Activity: Sufficiently Active (06/29/2022)   Exercise Vital Sign    Days of Exercise per Week: 5 days    Minutes of Exercise per Session: 30 min  Stress: No Stress Concern Present (06/29/2022)   [employer] of Occupational Health - Occupational Stress Questionnaire    Feeling of Stress : Not at all  Social Connections: Socially Integrated (06/29/2022)   Social Connection and Isolation Panel [NHANES]    Frequency of Communication with Friends and Family: Three times a week    Frequency of Social Gatherings with Friends and Family: Three times a week    Attends Religious Services: More than 4 times per year    Active Member of Clubs or Organizations: Yes    Attends Engineer, structural: More than 4 times per year    Marital Status: Married    Review of Systems Per HPI  Objective:  BP 136/71   Pulse (!) 102   Temp (!) 97.5 F (36.4 C)   Ht 5\' 10"  (1.778 m)   Wt 155 lb (70.3 kg)   SpO2 98%   BMI 22.24 kg/m      01/03/2023   10:01 AM 01/03/2023    9:49 AM 08/22/2022    8:32 AM  BP/Weight  Systolic BP 136 150 120  Diastolic BP 71 68 80  Wt. (Lbs)  155 154  BMI  22.24 kg/m2 22.1 kg/m2    Physical Exam Vitals and nursing note reviewed.  Constitutional:      General: He is not in acute distress.    Appearance: Normal appearance.  HENT:     Head: Normocephalic and atraumatic.  Cardiovascular:     Rate and Rhythm: Normal rate and regular rhythm.  Pulmonary:     Effort: Pulmonary effort is normal.     Breath sounds: Normal breath sounds.  Musculoskeletal:     Comments: Mild tenderness of the left lumbar paraspinal musculature.  Neurological:     Mental Status: He is alert.  Psychiatric:        Mood and Affect: Mood normal.        Behavior: Behavior normal.     Lab Results  Component Value Date   WBC 5.1 12/03/2021   HGB 14.4 12/03/2021   HCT  40.7 12/03/2021   PLT 259 12/03/2021   GLUCOSE 103 (H) 08/22/2022   CHOL 180 12/03/2021   TRIG 67 12/03/2021   HDL 75 12/03/2021   LDLCALC 92 12/03/2021   ALT 35 08/22/2022   AST 44 (H) 08/22/2022   NA 143 08/22/2022   K 4.0 08/22/2022   CL 101 08/22/2022   CREATININE 1.71 (H) 08/22/2022   BUN 16 08/22/2022   CO2 34 (H) 08/22/2022   TSH 0.74 03/20/2022   PSA 0.81 09/09/2013   HGBA1C 5.4 12/03/2021     Assessment & Plan:   Problem List Items Addressed This Visit       Cardiovascular and Mediastinum   Essential hypertension, benign - Primary    Stable.  Continue current medications.  Medications refilled today.      Relevant Medications   simvastatin (ZOCOR) 20 MG tablet   hydrochlorothiazide (HYDRODIURIL) 25 MG tablet   enalapril (VASOTEC) 20 MG tablet   amLODipine (NORVASC) 10 MG tablet     Genitourinary   Stage 3a chronic kidney disease (HCC)    Labs today.      Relevant Orders   CBC   CMP14+EGFR   Microalbumin / creatinine urine ratio     Other   Hyperlipidemia    Lipid panel today to assess.  Continue statin.  Refilled today.      Relevant Medications   simvastatin (ZOCOR) 20 MG tablet   hydrochlorothiazide (HYDRODIURIL) 25 MG tablet   enalapril (VASOTEC) 20 MG tablet   amLODipine (NORVASC) 10 MG tablet   Other Relevant Orders   Lipid panel   Other Visit Diagnoses     Prostate cancer screening       Relevant Orders   PSA       Meds ordered this encounter  Medications   simvastatin (ZOCOR) 20 MG tablet    Sig: TAKE ONE TABLET (20 MG TOTAL) BY MOUTH AT BEDTIME.    Dispense:  90 tablet    Refill:  3   hydrochlorothiazide (HYDRODIURIL) 25 MG tablet    Sig: Take 1 tablet (25 mg total) by mouth daily.    Dispense:  90 tablet    Refill:  3   enalapril (VASOTEC) 20 MG tablet    Sig: Take 1 tablet (20 mg total) by mouth 2 (two) times daily.    Dispense:  180 tablet    Refill:  3   amLODipine (NORVASC) 10 MG tablet    Sig: Take 1 tablet (10  mg total) by mouth daily.  Dispense:  90 tablet    Refill:  3    Follow-up:  Return in about 6 months (around 07/03/2023) for Follow up Chronic medical issues.  Everlene Other DO [facility]

## 2023-01-03 NOTE — Assessment & Plan Note (Signed)
Labs today

## 2023-01-03 NOTE — Assessment & Plan Note (Signed)
Lipid panel today to assess.  Continue statin.  Refilled today.

## 2023-01-03 NOTE — Assessment & Plan Note (Signed)
Stable.  Continue current medications.  Medications refilled today.

## 2023-02-14 ENCOUNTER — Ambulatory Visit (INDEPENDENT_AMBULATORY_CARE_PROVIDER_SITE_OTHER): Payer: Medicare Other

## 2023-02-14 VITALS — Ht 70.0 in | Wt 155.0 lb

## 2023-02-14 DIAGNOSIS — Z0000 Encounter for general adult medical examination without abnormal findings: Secondary | ICD-10-CM

## 2023-02-14 DIAGNOSIS — Z1159 Encounter for screening for other viral diseases: Secondary | ICD-10-CM

## 2023-02-14 NOTE — Patient Instructions (Signed)
Charles Hurst , Thank you for taking time to come for your Medicare Wellness Visit. I appreciate your ongoing commitment to your health goals. Please review the following plan we discussed and let me know if I can assist you in the future.   Referrals/Orders/Follow-Ups/Clinician Recommendations:  A Hepatitis C Screening has been ordered for you today. You do not have to fast to have this lab drawn.   Next Medicare Annual Wellness Visit: February 20, 2024 at 8:00 am video visit  This is a list of the screening recommended for you and due dates:  Health Maintenance  Topic Date Due   Hepatitis C Screening  Never done   DTaP/Tdap/Td vaccine (1 - Tdap) Never done   Zoster (Shingles) Vaccine (1 of 2) Never done   Pneumonia Vaccine (1 of 2 - PCV) 03/08/2023*   Flu Shot  05/26/2023*   Medicare Annual Wellness Visit  02/14/2024   Colon Cancer Screening  10/26/2025   HPV Vaccine  Aged Out   COVID-19 Vaccine  Discontinued  *Topic was postponed. The date shown is not the original due date.    Advanced directives: (ACP Link)Information on Advanced Care Planning can be found at [facility] Advance Health Care Directives Advance Health Care Directives ([email or web address]   Next Medicare Annual Wellness Visit scheduled for next year: Yes  Preventive Care 65 Years and Older, Male Preventive care refers to lifestyle choices and visits with your health care provider that can promote health and wellness. Preventive care visits are also called wellness exams. What can I expect for my preventive care visit? Counseling During your preventive care visit, your health care provider may ask about your: Medical history, including: Past medical problems. Family medical history. History of falls. Current health, including: Emotional well-being. Home life and relationship well-being. Sexual activity. Memory and ability to understand (cognition). Lifestyle, including: Alcohol, nicotine or  tobacco, and drug use. Access to firearms. Diet, exercise, and sleep habits. Work and work Astronomer. Sunscreen use. Safety issues such as seatbelt and bike helmet use. Physical exam Your health care provider will check your: Height and weight. These may be used to calculate your BMI (body mass index). BMI is a measurement that tells if you are at a healthy weight. Waist circumference. This measures the distance around your waistline. This measurement also tells if you are at a healthy weight and may help predict your risk of certain diseases, such as type 2 diabetes and high blood pressure. Heart rate and blood pressure. Body temperature. Skin for abnormal spots. What immunizations do I need?  Vaccines are usually given at various ages, according to a schedule. Your health care provider will recommend vaccines for you based on your age, medical history, and lifestyle or other factors, such as travel or where you work. What tests do I need? Screening Your health care provider may recommend screening tests for certain conditions. This may include: Lipid and cholesterol levels. Diabetes screening. This is done by checking your blood sugar (glucose) after you have not eaten for a while (fasting). Hepatitis C test. Hepatitis B test. HIV (human immunodeficiency virus) test. STI (sexually transmitted infection) testing, if you are at risk. Lung cancer screening. Colorectal cancer screening. Prostate cancer screening. Abdominal aortic aneurysm (AAA) screening. You may need this if you are a current or former smoker. Talk with your health care provider about your test results, treatment options, and if necessary, the need for more tests. Follow these instructions at home: Eating and drinking  Eat a diet that includes fresh fruits and vegetables, whole grains, lean protein, and low-fat dairy products. Limit your intake of foods with high amounts of sugar, saturated fats, and salt. Take  vitamin and mineral supplements as recommended by your health care provider. Do not drink alcohol if your health care provider tells you not to drink. If you drink alcohol: Limit how much you have to 0-2 drinks a day. Know how much alcohol is in your drink. In the [place] one drink equals one 12 oz bottle of beer (355 mL), one 5 oz glass of wine (148 mL), or one 1 oz glass of hard liquor (44 mL). Lifestyle Brush your teeth every morning and night with fluoride toothpaste. Floss one time each day. Exercise for at least 30 minutes 5 or more days each week. Do not use any products that contain nicotine or tobacco. These products include cigarettes, chewing tobacco, and vaping devices, such as e-cigarettes. If you need help quitting, ask your health care provider. Do not use drugs. If you are sexually active, practice safe sex. Use a condom or other form of protection to prevent STIs. Take aspirin only as told by your health care provider. Make sure that you understand how much to take and what form to take. Work with your health care provider to find out whether it is safe and beneficial for you to take aspirin daily. Ask your health care provider if you need to take a cholesterol-lowering medicine (statin). Find healthy ways to manage stress, such as: Meditation, yoga, or listening to music. Journaling. Talking to a trusted person. Spending time with friends and family. Safety Always wear your seat belt while driving or riding in a vehicle. Do not drive: If you have been drinking alcohol. Do not ride with someone who has been drinking. When you are tired or distracted. While texting. If you have been using any mind-altering substances or drugs. Wear a helmet and other protective equipment during sports activities. If you have firearms in your house, make sure you follow all gun safety procedures. Minimize exposure to UV radiation to reduce your risk of skin cancer. What's next? Visit your  health care provider once a year for an annual wellness visit. Ask your health care provider how often you should have your eyes and teeth checked. Stay up to date on all vaccines. This information is not intended to replace advice given to you by your health care provider. Make sure you discuss any questions you have with your health care provider. Document Revised: 08/09/2020 Document Reviewed: 08/09/2020 Elsevier Patient Education  2024 [employer]. Understanding Your Risk for Falls Millions of people have serious injuries from falls each year. It is important to understand your risk of falling. Talk with your health care provider about your risk and what you can do to lower it. If you do have a serious fall, make sure to tell your provider. Falling once raises your risk of falling again. How can falls affect me? Serious injuries from falls are common. These include: Broken bones, such as hip fractures. Head injuries, such as traumatic brain injuries (TBI) or concussions. A fear of falling can cause you to avoid activities and stay at home. This can make your muscles weaker and raise your risk for a fall. What can increase my risk? There are a number of risk factors that increase your risk for falling. The more risk factors you have, the higher your risk of falling. Serious injuries from a fall happen  most often to people who are older than 69 years old. Teenagers and young adults ages 26-29 are also at higher risk. Common risk factors include: Weakness in the lower body. Being generally weak or confused due to long-term (chronic) illness. Dizziness or balance problems. Poor vision. Medicines that cause dizziness or drowsiness. These may include: Medicines for your blood pressure, heart, anxiety, insomnia, or swelling (edema). Pain medicines. Muscle relaxants. Other risk factors include: Drinking alcohol. Having had a fall in the past. Having foot pain or wearing improper  footwear. Working at a dangerous job. Having any of the following in your home: Tripping hazards, such as floor clutter or loose rugs. Poor lighting. Pets. Having dementia or memory loss. What actions can I take to lower my risk of falling?     Physical activity Stay physically fit. Do strength and balance exercises. Consider taking a regular class to build strength and balance. Yoga and tai chi are good options. Vision Have your eyes checked every year and your prescription for glasses or contacts updated as needed. Shoes and walking aids Wear non-skid shoes. Wear shoes that have rubber soles and low heels. Do not wear high heels. Do not walk around the house in socks or slippers. Use a cane or walker as told by your provider. Home safety Attach secure railings on both sides of your stairs. Install grab bars for your bathtub, shower, and toilet. Use a non-skid mat in your bathtub or shower. Attach bath mats securely with double-sided, non-slip rug tape. Use good lighting in all rooms. Keep a flashlight near your bed. Make sure there is a clear path from your bed to the bathroom. Use night-lights. Do not use throw rugs. Make sure all carpeting is taped or tacked down securely. Remove all clutter from walkways and stairways, including extension cords. Repair uneven or broken steps and floors. Avoid walking on icy or slippery surfaces. Walk on the grass instead of on icy or slick sidewalks. Use ice melter to get rid of ice on walkways in the winter. Use a cordless phone. Questions to ask your health care provider Can you help me check my risk for a fall? Do any of my medicines make me more likely to fall? Should I take a vitamin D supplement? What exercises can I do to improve my strength and balance? Should I make an appointment to have my vision checked? Do I need a bone density test to check for weak bones (osteoporosis)? Would it help to use a cane or a walker? Where to find  more information Centers for Disease Control and Prevention, STEADI: TonerPromos.no Community-Based Fall Prevention Programs: TonerPromos.no [employer] on Aging: BaseRingTones.pl Contact a health care provider if: You fall at home. You are afraid of falling at home. You feel weak, drowsy, or dizzy. This information is not intended to replace advice given to you by your health care provider. Make sure you discuss any questions you have with your health care provider. Document Revised: 10/15/2021 Document Reviewed: 10/15/2021 Elsevier Patient Education  2024 [employer].

## 2023-02-14 NOTE — Progress Notes (Signed)
 Because this visit was a virtual/telehealth visit,  certain criteria was not obtained, such a blood pressure, CBG if applicable, and timed get up and go. Any medications not marked as "taking" were not mentioned during the medication reconciliation part of the visit. Any vitals not documented were not able to be obtained due to this being a telehealth visit or patient was unable to self-report a recent blood pressure reading due to a lack of equipment at home via telehealth. Vitals that have been documented are verbally provided by the patient.   Subjective:   Charles Hurst is a 67 y.o. male who presents for Medicare Annual/Subsequent preventive examination.  Visit Complete: Virtual I connected with  Charles Hurst Hurst 02/14/23 by a audio enabled telemedicine application and verified that I am speaking with the correct person using two identifiers.  Patient Location: Home  Provider Location: Home Office  I discussed the limitations of evaluation and management by telemedicine. The patient expressed understanding and agreed to proceed.  Vital Signs: Because this visit was a virtual/telehealth visit, some criteria may be missing or patient reported. Any vitals not documented were not able to be obtained and vitals that have been documented are patient reported.  Patient Medicare AWV questionnaire was completed by the patient Hurst na; I have confirmed that all information answered by patient is correct and no changes since this date.  Cardiac Risk Factors include: advanced age (>61men, >60 women);hypertension;male gender     Objective:    Today's Vitals   02/14/23 1110  Weight: 155 lb (70.3 kg)  Height: 5\' 10"  (1.778 m)   Body mass index is 22.24 kg/m.     02/14/2023   11:13 AM 02/01/2022    4:08 PM 01/16/2021    4:01 PM  Advanced Directives  Does Patient Have a Medical Advance Directive? No No No  Would patient like information Hurst creating a medical advance directive? No - Patient  declined Yes (MAU/Ambulatory/Procedural Areas - Information given) No - Patient declined    Current Medications (verified) Outpatient Encounter Medications as of 02/14/2023  Medication Sig   amLODipine (NORVASC) 10 MG tablet Take 1 tablet (10 mg total) by mouth daily.   enalapril (VASOTEC) 20 MG tablet Take 1 tablet (20 mg total) by mouth 2 (two) times daily.   hydrochlorothiazide (HYDRODIURIL) 25 MG tablet Take 1 tablet (25 mg total) by mouth daily.   simvastatin (ZOCOR) 20 MG tablet TAKE ONE TABLET (20 MG TOTAL) BY MOUTH AT BEDTIME.   No facility-administered encounter medications Hurst file as of 02/14/2023.    Allergies (verified) Patient has no known allergies.   History: History reviewed. No pertinent past medical history. History reviewed. No pertinent surgical history. History reviewed. No pertinent family history. Social History   Socioeconomic History   Marital status: Married    Spouse name: Renea Ee   Number of children: 2   Years of education: Not Hurst file   Highest education level: Bachelor's degree (e.g., BA, AB, BS)  Occupational History   Not Hurst file  Tobacco Use   Smoking status: Never   Smokeless tobacco: Never  Substance and Sexual Activity   Alcohol use: Not Hurst file   Drug use: Not Hurst file   Sexual activity: Not Hurst file  Other Topics Concern   Not Hurst file  Social History Narrative   2 sons   1 granddaugther   Social Drivers of Health   Financial Resource Strain: Low Risk  (02/14/2023)   Overall Financial Resource  Strain (CARDIA)    Difficulty of Paying Living Expenses: Not hard at all  Food Insecurity: No Food Insecurity (02/14/2023)   Hunger Vital Sign    Worried About Running Out of Food in the Last Year: Never true    Ran Out of Food in the Last Year: Never true  Transportation Needs: No Transportation Needs (02/14/2023)   PRAPARE - Administrator, Civil Service (Medical): No    Lack of Transportation (Non-Medical): No  Physical  Activity: Sufficiently Active (02/14/2023)   Exercise Vital Sign    Days of Exercise per Week: 7 days    Minutes of Exercise per Session: 30 min  Stress: No Stress Concern Present (02/14/2023)   [employer] of Occupational Health - Occupational Stress Questionnaire    Feeling of Stress : Not at all  Social Connections: Socially Integrated (02/14/2023)   Social Connection and Isolation Panel [NHANES]    Frequency of Communication with Friends and Family: More than three times a week    Frequency of Social Gatherings with Friends and Family: More than three times a week    Attends Religious Services: More than 4 times per year    Active Member of [employer] or Organizations: Yes    Attends Engineer, structural: More than 4 times per year    Marital Status: Married    Tobacco Counseling Counseling given: Yes   Clinical Intake:  Pre-visit preparation completed: Yes  Pain : No/denies pain     BMI - recorded: 22.24 Nutritional Risks: None Diabetes: No  How often do you need to have someone help you when you read instructions, pamphlets, or other written materials from your doctor or pharmacy?: 1 - Never  Interpreter Needed?: No  Information entered by ::  gale Lapid, CMA   Activities of Daily Living    02/14/2023   11:12 AM  In your present state of health, do you have any difficulty performing the following activities:  Hearing? 0  Vision? 0  Difficulty concentrating or making decisions? 0  Walking or climbing stairs? 0  Dressing or bathing? 0  Doing errands, shopping? 0  Preparing Food and eating ? N  Using the Toilet? N  In the past six months, have you accidently leaked urine? N  Do you have problems with loss of bowel control? N  Managing your Medications? N  Managing your Finances? N  Housekeeping or managing your Housekeeping? N    Patient Care Team: Tommie Sams, DO as PCP - General (Family Medicine)  Indicate any recent Medical Services  you may have received from other than [facility] providers in the past year (date may be approximate).     Assessment:   This is a routine wellness examination for Charles Hurst.  Hearing/Vision screen No results found.   Goals Addressed             This Visit's Progress    Exercise 3x per week (30 min per time)   Hurst track    Continue to exercise, monitor BP and stay healthy/       Depression Screen    02/14/2023   11:14 AM 01/03/2023    9:52 AM 07/03/2022   10:17 AM 03/07/2022    8:12 AM 02/01/2022    4:06 PM 12/05/2021    8:36 AM 01/16/2021    3:58 PM  PHQ 2/9 Scores  PHQ - 2 Score 0 0 0 0 0 0 0  PHQ- 9 Score 0 0  0  Fall Risk    02/14/2023   11:13 AM 01/03/2023    9:52 AM 07/03/2022   10:16 AM 03/07/2022    8:12 AM 02/01/2022    4:05 PM  Fall Risk   Falls in the past year? 0 0 0 0 0  Number falls in past yr: 0 0  0 0  Injury with Fall? 0 0  0 0  Risk for fall due to : No Fall Risks No Fall Risks No Fall Risks  No Fall Risks  Follow up Falls prevention discussed Falls evaluation completed Falls evaluation completed  Falls evaluation completed;Education provided;Falls prevention discussed    MEDICARE RISK AT HOME: Medicare Risk at Home Any stairs in or around the home?: No If so, are there any without handrails?: No Home free of loose throw rugs in walkways, pet beds, electrical cords, etc?: Yes Adequate lighting in your home to reduce risk of falls?: Yes Life alert?: No Use of a cane, walker or w/c?: No Grab bars in the bathroom?: No Shower chair or bench in shower?: No Elevated toilet seat or a handicapped toilet?: No  TIMED UP AND GO:  Was the test performed?  No    Cognitive Function:        02/14/2023   11:14 AM 02/01/2022    4:09 PM 01/16/2021    4:02 PM  6CIT Screen  What Year? 0 points 0 points 0 points  What month? 0 points 0 points 0 points  What time? 0 points 0 points 0 points  Count back from 20 0 points 0 points 0 points  Months in reverse  0 points 0 points 0 points  Repeat phrase 0 points 0 points 0 points  Total Score 0 points 0 points 0 points    Immunizations Immunization History  Administered Date(s) Administered   Janssen (J&J) SARS-COV-2 Vaccination 05/19/2019    TDAP status: Due, Education has been provided regarding the importance of this vaccine. Advised may receive this vaccine at local pharmacy or Health Dept. Aware to provide a copy of the vaccination record if obtained from local pharmacy or Health Dept. Verbalized acceptance and understanding.  Flu Vaccine status: Due, Education has been provided regarding the importance of this vaccine. Advised may receive this vaccine at local pharmacy or Health Dept. Aware to provide a copy of the vaccination record if obtained from local pharmacy or Health Dept. Verbalized acceptance and understanding.  Pneumococcal vaccine status: Due, Education has been provided regarding the importance of this vaccine. Advised may receive this vaccine at local pharmacy or Health Dept. Aware to provide a copy of the vaccination record if obtained from local pharmacy or Health Dept. Verbalized acceptance and understanding.  Covid-19 vaccine status: Information provided Hurst how to obtain vaccines.   Qualifies for Shingles Vaccine? Yes   Zostavax completed No   Shingrix Completed?: No.    Education has been provided regarding the importance of this vaccine. Patient has been advised to call insurance company to determine out of pocket expense if they have not yet received this vaccine. Advised may also receive vaccine at local pharmacy or Health Dept. Verbalized acceptance and understanding.  Screening Tests Health Maintenance  Topic Date Due   Hepatitis C Screening  Never done   DTaP/Tdap/Td (1 - Tdap) Never done   Zoster Vaccines- Shingrix (1 of 2) Never done   Medicare Annual Wellness (AWV)  02/02/2023   Pneumonia Vaccine 71+ Years old (1 of 2 - PCV) 03/08/2023 (Originally 02/11/1962)  INFLUENZA VACCINE  05/26/2023 (Originally 09/26/2022)   Colonoscopy  10/26/2025   HPV VACCINES  Aged Out   COVID-19 Vaccine  Discontinued    Health Maintenance  Health Maintenance Due  Topic Date Due   Hepatitis C Screening  Never done   DTaP/Tdap/Td (1 - Tdap) Never done   Zoster Vaccines- Shingrix (1 of 2) Never done   Medicare Annual Wellness (AWV)  02/02/2023    Colorectal cancer screening: Type of screening: Colonoscopy. Completed 10/27/2015. Repeat every 10 years  Lung Cancer Screening: (Low Dose CT Chest recommended if Age 67-80 years, 20 pack-year currently smoking OR have quit w/in 15years.) does not qualify.   Lung Cancer Screening Referral: na  Additional Screening:  Hepatitis C Screening: does qualify  Vision Screening: Recommended annual ophthalmology exams for early detection of glaucoma and other disorders of the eye. Is the patient up to date with their annual eye exam?  Yes  Who is the provider or what is the name of the office in which the patient attends annual eye exams? Daisy Lazar OD If pt is not established with a provider, would they like to be referred to a provider to establish care? No .   Dental Screening: Recommended annual dental exams for proper oral hygiene  Diabetic Foot Exam: na   Community Resource Referral / Chronic Care Management: CRR required this visit?  No   CCM required this visit?  No     Plan:     I have personally reviewed and noted the following in the patient's chart:   Medical and social history Use of alcohol, tobacco or illicit drugs  Current medications and supplements including opioid prescriptions. Patient is not currently taking opioid prescriptions. Functional ability and status Nutritional status Physical activity Advanced directives List of other physicians Hospitalizations, surgeries, and ER visits in previous 12 months Vitals Screenings to include cognitive, depression, and falls Referrals and  appointments  In addition, I have reviewed and discussed with patient certain preventive protocols, quality metrics, and best practice recommendations. A written personalized care plan for preventive services as well as general preventive health recommendations were provided to patient.     Jordan Hawks Shaddai Shapley, CMA   02/14/2023   After Visit Summary: (MyChart) Due to this being a telephonic visit, the after visit summary with patients personalized plan was offered to patient via MyChart   Nurse Notes: n/a

## 2023-07-04 ENCOUNTER — Ambulatory Visit: Payer: Medicare Other | Admitting: Family Medicine

## 2023-11-16 ENCOUNTER — Encounter: Payer: Self-pay | Admitting: Family Medicine

## 2023-11-17 ENCOUNTER — Other Ambulatory Visit: Payer: Self-pay | Admitting: Family Medicine

## 2023-11-17 MED ORDER — DOXYCYCLINE HYCLATE 100 MG PO TABS: 100.0000 mg | ORAL_TABLET | Freq: Two times a day (BID) | ORAL | 0 refills | Status: DC

## 2023-12-31 ENCOUNTER — Telehealth: Payer: Self-pay

## 2024-01-08 ENCOUNTER — Telehealth: Payer: Self-pay

## 2024-01-08 NOTE — Telephone Encounter (Signed)
 Called patient to schedule office visit with PCP. Left message.

## 2024-01-13 ENCOUNTER — Ambulatory Visit (INDEPENDENT_AMBULATORY_CARE_PROVIDER_SITE_OTHER): Payer: Self-pay | Admitting: Family Medicine

## 2024-01-13 ENCOUNTER — Encounter: Payer: Self-pay | Admitting: Family Medicine

## 2024-01-13 VITALS — BP 144/64 | HR 86 | Temp 97.7°F | Ht 70.0 in | Wt 159.0 lb

## 2024-01-13 DIAGNOSIS — E785 Hyperlipidemia, unspecified: Secondary | ICD-10-CM | POA: Diagnosis not present

## 2024-01-13 DIAGNOSIS — Z125 Encounter for screening for malignant neoplasm of prostate: Secondary | ICD-10-CM | POA: Diagnosis not present

## 2024-01-13 DIAGNOSIS — N1831 Chronic kidney disease, stage 3a: Secondary | ICD-10-CM

## 2024-01-13 DIAGNOSIS — I10 Essential (primary) hypertension: Secondary | ICD-10-CM | POA: Diagnosis not present

## 2024-01-13 MED ORDER — SIMVASTATIN 20 MG PO TABS: ORAL_TABLET | ORAL | 3 refills | Status: AC

## 2024-01-13 MED ORDER — ENALAPRIL MALEATE 20 MG PO TABS: 20.0000 mg | ORAL_TABLET | Freq: Two times a day (BID) | ORAL | 3 refills | Status: AC

## 2024-01-13 MED ORDER — HYDROCHLOROTHIAZIDE 25 MG PO TABS: 25.0000 mg | ORAL_TABLET | Freq: Every day | ORAL | 3 refills | Status: AC

## 2024-01-13 MED ORDER — AMLODIPINE BESYLATE 10 MG PO TABS: 10.0000 mg | ORAL_TABLET | Freq: Every day | ORAL | 3 refills | Status: DC

## 2024-01-13 NOTE — Assessment & Plan Note (Signed)
 Has been stable.  Labs here today to assess.

## 2024-01-13 NOTE — Patient Instructions (Signed)
Labs today.  Follow up in 6 months.  Take care  Dr. Alyxis Grippi  

## 2024-01-13 NOTE — Assessment & Plan Note (Signed)
 Fairly well-controlled.  Lipid panel today.  Continue simvastatin .

## 2024-01-13 NOTE — Assessment & Plan Note (Signed)
 Fair control.  Continue current medications.  Labs today.

## 2024-01-13 NOTE — Progress Notes (Signed)
 Subjective:  Patient ID: Charles Hurst, male    DOB: 1956-02-14  Age: 68 y.o. MRN: [id number]  CC:   Chief Complaint  Patient presents with   Hypertension    HPI:  68 year old male with hypertension, CKD 3, hyperlipidemia presents for follow-up.  Patient states that he feels well.  He has no complaints at this time.  BP slightly elevated here today.  Fair control.  He is on enalapril , amlodipine , and HCTZ.  Last LDL was 92.  Needs labs today.  He is compliant with simvastatin .  Recommended Tdap, shingles, influenza, and pneumococcal vaccines.  He will wait at this time.  Patient Active Problem List   Diagnosis Date Noted   Vitamin D  insufficiency 08/22/2022   Elevated alkaline phosphatase level 03/07/2022   Stage 3a chronic kidney disease (HCC) 06/05/2021   Essential hypertension, benign 12/13/2012   Hyperlipidemia 12/13/2012    Social Hx   Social History   Socioeconomic History   Marital status: Married    Spouse name: Hargis   Number of children: 2   Years of education: Not on file   Highest education level: Bachelor's degree (e.g., BA, AB, BS)  Occupational History   Not on file  Tobacco Use   Smoking status: Never   Smokeless tobacco: Never  Substance and Sexual Activity   Alcohol use: Not on file   Drug use: Not on file   Sexual activity: Not on file  Other Topics Concern   Not on file  Social History Narrative   2 sons   1 granddaugther   Social Drivers of Health   Financial Resource Strain: Low Risk  (02/14/2023)   Overall Financial Resource Strain (CARDIA)    Difficulty of Paying Living Expenses: Not hard at all  Food Insecurity: No Food Insecurity (02/14/2023)   Hunger Vital Sign    Worried About Running Out of Food in the Last Year: Never true    Ran Out of Food in the Last Year: Never true  Transportation Needs: No Transportation Needs (02/14/2023)   PRAPARE - Administrator, Civil Service (Medical): No    Lack of  Transportation (Non-Medical): No  Physical Activity: Sufficiently Active (02/14/2023)   Exercise Vital Sign    Days of Exercise per Week: 7 days    Minutes of Exercise per Session: 30 min  Stress: No Stress Concern Present (02/14/2023)   [employer] of Occupational Health - Occupational Stress Questionnaire    Feeling of Stress : Not at all  Social Connections: Socially Integrated (02/14/2023)   Social Connection and Isolation Panel    Frequency of Communication with Friends and Family: More than three times a week    Frequency of Social Gatherings with Friends and Family: More than three times a week    Attends Religious Services: More than 4 times per year    Active Member of [employer] or Organizations: Yes    Attends Engineer, Structural: More than 4 times per year    Marital Status: Married    Review of Systems Per HPI  Objective:  BP (!) 144/64   Pulse 86   Temp 97.7 F (36.5 C)   Ht 5' 10 (1.778 m)   Wt 159 lb (72.1 kg)   SpO2 99%   BMI 22.81 kg/m      01/13/2024   10:03 AM 01/13/2024    9:40 AM 02/14/2023   11:10 AM  BP/Weight  Systolic BP 144 154 --  Diastolic BP 64  66 --  Wt. (Lbs)  159 155  BMI  22.81 kg/m2 22.24 kg/m2    Physical Exam Vitals and nursing note reviewed.  Constitutional:      General: He is not in acute distress.    Appearance: Normal appearance.  HENT:     Head: Normocephalic and atraumatic.  Eyes:     General:        Right eye: No discharge.        Left eye: No discharge.     Conjunctiva/sclera: Conjunctivae normal.  Cardiovascular:     Rate and Rhythm: Normal rate and regular rhythm.  Pulmonary:     Effort: Pulmonary effort is normal.     Breath sounds: Normal breath sounds. No wheezing, rhonchi or rales.  Neurological:     Mental Status: He is alert.  Psychiatric:        Mood and Affect: Mood normal.        Behavior: Behavior normal.     Lab Results  Component Value Date   WBC 5.1 12/03/2021   HGB 14.4  12/03/2021   HCT 40.7 12/03/2021   PLT 259 12/03/2021   GLUCOSE 103 (H) 08/22/2022   CHOL 180 12/03/2021   TRIG 67 12/03/2021   HDL 75 12/03/2021   LDLCALC 92 12/03/2021   ALT 35 08/22/2022   AST 44 (H) 08/22/2022   NA 143 08/22/2022   K 4.0 08/22/2022   CL 101 08/22/2022   CREATININE 1.71 (H) 08/22/2022   BUN 16 08/22/2022   CO2 34 (H) 08/22/2022   TSH 0.74 03/20/2022   PSA 0.81 09/09/2013   HGBA1C 5.4 12/03/2021     Assessment & Plan:  Essential hypertension, benign Assessment & Plan: Fair control.  Continue current medications.  Labs today.  Orders: -     amLODIPine  Besylate; Take 1 tablet (10 mg total) by mouth daily.  Dispense: 90 tablet; Refill: 3 -     Enalapril  Maleate; Take 1 tablet (20 mg total) by mouth 2 (two) times daily.  Dispense: 180 tablet; Refill: 3 -     hydroCHLOROthiazide ; Take 1 tablet (25 mg total) by mouth daily.  Dispense: 90 tablet; Refill: 3  Stage 3a chronic kidney disease (HCC) Assessment & Plan: Has been stable.  Labs here today to assess.  Orders: -     Enalapril  Maleate; Take 1 tablet (20 mg total) by mouth 2 (two) times daily.  Dispense: 180 tablet; Refill: 3 -     CBC -     CMP14+EGFR -     Microalbumin / creatinine urine ratio  Hyperlipidemia, unspecified hyperlipidemia type Assessment & Plan: Fairly well-controlled.  Lipid panel today.  Continue simvastatin .  Orders: -     Simvastatin ; TAKE ONE TABLET (20 MG TOTAL) BY MOUTH AT BEDTIME.  Dispense: 90 tablet; Refill: 3 -     Lipid panel  Screening PSA (prostate specific antigen) -     PSA    Follow-up: 6 months  Ladonya Jerkins Bluford DO [facility]

## 2024-01-14 ENCOUNTER — Ambulatory Visit: Payer: Self-pay | Admitting: Family Medicine

## 2024-01-15 LAB — CBC
Hematocrit: 41.9 % (ref 37.5–51.0)
Hemoglobin: 13.7 g/dL (ref 13.0–17.7)
MCH: 32.5 pg (ref 26.6–33.0)
MCHC: 32.7 g/dL (ref 31.5–35.7)
MCV: 100 fL — ABNORMAL HIGH (ref 79–97)
Platelets: 290 x10E3/uL (ref 150–450)
RBC: 4.21 x10E6/uL (ref 4.14–5.80)
RDW: 12.9 % (ref 11.6–15.4)
WBC: 5.2 x10E3/uL (ref 3.4–10.8)

## 2024-01-15 LAB — CMP14+EGFR
ALT: 19 IU/L (ref 0–44)
AST: 29 IU/L (ref 0–40)
Albumin: 4.6 g/dL (ref 3.9–4.9)
Alkaline Phosphatase: 148 IU/L — ABNORMAL HIGH (ref 47–123)
BUN/Creatinine Ratio: 11 (ref 10–24)
BUN: 21 mg/dL (ref 8–27)
Bilirubin Total: 0.4 mg/dL (ref 0.0–1.2)
CO2: 24 mmol/L (ref 20–29)
Calcium: 10.3 mg/dL — ABNORMAL HIGH (ref 8.6–10.2)
Chloride: 102 mmol/L (ref 96–106)
Creatinine, Ser: 1.95 mg/dL — ABNORMAL HIGH (ref 0.76–1.27)
Globulin, Total: 2.7 g/dL (ref 1.5–4.5)
Glucose: 146 mg/dL — ABNORMAL HIGH (ref 70–99)
Potassium: 4 mmol/L (ref 3.5–5.2)
Sodium: 143 mmol/L (ref 134–144)
Total Protein: 7.3 g/dL (ref 6.0–8.5)
eGFR: 37 mL/min/1.73 — ABNORMAL LOW (ref >59)

## 2024-01-15 LAB — PSA: Prostate Specific Ag, Serum: 1.2 ng/mL (ref 0.0–4.0)

## 2024-01-15 LAB — MICROALBUMIN / CREATININE URINE RATIO
Creatinine, Urine: 116.8 mg/dL
Microalb/Creat Ratio: 4 mg/g{creat} (ref 0–29)
Microalbumin, Urine: 4.1 ug/mL

## 2024-01-15 LAB — LIPID PANEL
Chol/HDL Ratio: 2.5 ratio (ref 0.0–5.0)
Cholesterol, Total: 196 mg/dL (ref 100–199)
HDL: 78 mg/dL (ref >39)
LDL Chol Calc (NIH): 99 mg/dL (ref 0–99)
Triglycerides: 108 mg/dL (ref 0–149)
VLDL Cholesterol Cal: 19 mg/dL (ref 5–40)

## 2024-01-19 ENCOUNTER — Other Ambulatory Visit: Payer: Self-pay

## 2024-01-19 DIAGNOSIS — N1831 Chronic kidney disease, stage 3a: Secondary | ICD-10-CM

## 2024-01-19 DIAGNOSIS — I10 Essential (primary) hypertension: Secondary | ICD-10-CM

## 2024-02-09 ENCOUNTER — Other Ambulatory Visit (HOSPITAL_COMMUNITY): Payer: Self-pay | Admitting: Nephrology

## 2024-02-09 DIAGNOSIS — N1832 Chronic kidney disease, stage 3b: Secondary | ICD-10-CM

## 2024-02-16 ENCOUNTER — Ambulatory Visit (HOSPITAL_COMMUNITY)
Admission: RE | Admit: 2024-02-16 | Discharge: 2024-02-16 | Disposition: A | Discharge status: Home / Self Care | Source: Ambulatory Visit | Attending: Nephrology | Admitting: Nephrology

## 2024-02-16 DIAGNOSIS — N1832 Chronic kidney disease, stage 3b: Secondary | ICD-10-CM | POA: Insufficient documentation

## 2024-02-27 ENCOUNTER — Ambulatory Visit: Payer: Medicare Other

## 2024-03-02 ENCOUNTER — Other Ambulatory Visit: Payer: Self-pay | Admitting: Family Medicine

## 2024-03-02 DIAGNOSIS — I10 Essential (primary) hypertension: Secondary | ICD-10-CM

## 2024-04-08 ENCOUNTER — Ambulatory Visit

## 2024-07-12 ENCOUNTER — Ambulatory Visit: Admitting: Family Medicine
